# Patient Record
Sex: Male | Born: 1995 | Race: White | Hispanic: No | Marital: Single | Smoking: Current every day smoker
Health system: Southern US, Community
[De-identification: ages and names within clinical notes are randomized; demographics above are authoritative.]

---
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
## 2017-09-18 ENCOUNTER — Ambulatory Visit: Payer: Self-pay | Admitting: Family Medicine

## 2017-09-20 ENCOUNTER — Ambulatory Visit: Payer: 59 | Admitting: Family Medicine

## 2017-09-20 ENCOUNTER — Encounter: Payer: Self-pay | Admitting: Family Medicine

## 2017-09-20 VITALS — BP 124/80 | HR 72 | Ht 75.0 in | Wt 196.0 lb

## 2017-09-20 DIAGNOSIS — B354 Tinea corporis: Secondary | ICD-10-CM | POA: Diagnosis not present

## 2017-09-20 DIAGNOSIS — F17200 Nicotine dependence, unspecified, uncomplicated: Secondary | ICD-10-CM | POA: Diagnosis not present

## 2017-09-20 DIAGNOSIS — B079 Viral wart, unspecified: Secondary | ICD-10-CM

## 2017-09-20 DIAGNOSIS — L03111 Cellulitis of right axilla: Secondary | ICD-10-CM | POA: Diagnosis not present

## 2017-09-20 MED ORDER — MUPIROCIN 2 % EX OINT: 1.0000 "application " | TOPICAL_OINTMENT | Freq: Two times a day (BID) | CUTANEOUS | 0 refills | Status: DC

## 2017-09-20 MED ORDER — NICOTINE 7 MG/24HR TD PT24: 7.0000 mg | MEDICATED_PATCH | Freq: Every day | TRANSDERMAL | 0 refills | Status: DC

## 2017-09-20 MED ORDER — CEPHALEXIN 500 MG PO CAPS
500.0000 mg | ORAL_CAPSULE | Freq: Four times a day (QID) | ORAL | 0 refills | Status: DC
Start: 2017-09-20 — End: 2017-10-05

## 2017-09-20 MED ORDER — CLOTRIMAZOLE-BETAMETHASONE 1-0.05 % EX CREA: 1.0000 "application " | TOPICAL_CREAM | Freq: Two times a day (BID) | CUTANEOUS | 0 refills | Status: DC

## 2017-09-20 NOTE — Progress Notes (Signed)
Name: Richard Oneill   MRN: [id number]    DOB: [birth year]   Date:09/20/2017       Progress Note  Subjective  Chief Complaint  Chief Complaint  Patient presents with  . Nicotine Dependence    vaping- ready to quit/ discuss options  . warts on hand    Verbal consent for wart removal was obtained. Appropriate cleansing performed and liquid nitrogen applied to warts located on both hands and both elbows. Recent Vital Signs  BP 124/80   Pulse 72   Ht 6\' 3"  (1.905 m)   Wt 196 lb Warts multiple verucous warts both hands and both elbows./ present for "close to a year".  Aftercare Instructions:  Risks of bleeding and recurrence were discussed. Education provided about the possibility of blister formation. Patient has been advised to keep areas clean and dry. Educated about signs and symptoms of infection. All of the patient's questions were answered and the patient's concerns were addressed. Advised to return for re-treatment as needed.  Nicotine Dependence  Presents for initial visit. Symptoms include cravings. Symptoms are negative for insomnia and sore throat. Preferred tobacco type: vape. Preferred cigarettes are menthol. His urge triggers include company of smokers. His first smoke is from 6 to 8 AM. Past treatments include nothing.  Rash  This is a new problem. The current episode started 1 to 4 weeks ago. The problem has been gradually worsening since onset. The affected locations include the right axilla. The rash is characterized by burning and redness. Associated with: deordorant. Pertinent negatives include no cough, diarrhea, fever, joint pain, shortness of breath or sore throat.    No problem-specific Assessment & Plan notes found for this encounter.   No past medical history on file.  No past surgical history on file.  Family History  Problem Relation Age of Onset  . Hypertension Maternal Grandmother     Social History   Socioeconomic History  . Marital status: Single     Spouse name: Not on file  . Number of children: Not on file  . Years of education: Not on file  . Highest education level: Not on file  Occupational History  . Not on file  Social Needs  . Financial resource strain: Not on file  . Food insecurity:    Worry: Not on file    Inability: Not on file  . Transportation needs:    Medical: Not on file    Non-medical: Not on file  Tobacco Use  . Smoking status: Current Every Day Smoker    Types: E-cigarettes  . Smokeless tobacco: Never Used  Substance and Sexual Activity  . Alcohol use: No  . Drug use: Never  . Sexual activity: Not Currently  Lifestyle  . Physical activity:    Days per week: Not on file    Minutes per session: Not on file  . Stress: Not on file  Relationships  . Social connections:    Talks on phone: Not on file    Gets together: Not on file    Attends religious service: Not on file    Active member of club or organization: Not on file    Attends meetings of clubs or organizations: Not on file    Relationship status: Not on file  . Intimate partner violence:    Fear of current or ex partner: Not on file    Emotionally abused: Not on file    Physically abused: Not on file    Forced sexual activity: Not  on file  Other Topics Concern  . Not on file  Social History Narrative  . Not on file    No Known Allergies  No outpatient medications prior to visit.   No facility-administered medications prior to visit.     Review of Systems  Constitutional: Negative for chills, fever, malaise/fatigue and weight loss.  HENT: Negative for ear discharge, ear pain and sore throat.   Eyes: Negative for blurred vision.  Respiratory: Negative for cough, sputum production, shortness of breath and wheezing.   Cardiovascular: Negative for chest pain, palpitations and leg swelling.  Gastrointestinal: Negative for abdominal pain, blood in stool, constipation, diarrhea, heartburn, melena and nausea.  Genitourinary: Negative  for dysuria, frequency, hematuria and urgency.  Musculoskeletal: Negative for back pain, joint pain, myalgias and neck pain.  Skin: Positive for rash.  Neurological: Negative for dizziness, tingling, sensory change, focal weakness and headaches.  Endo/Heme/Allergies: Negative for environmental allergies and polydipsia. Does not bruise/bleed easily.  Psychiatric/Behavioral: Negative for depression and suicidal ideas. The patient is not nervous/anxious and does not have insomnia.      Objective  Vitals:   09/20/17 1111  BP: 124/80  Pulse: 72  Weight: 196 lb (88.9 kg)  Height: 6\' 3"  (1.905 m)   Physical Exam  Constitutional: He is oriented to person, place, and time.  HENT:  Head: Normocephalic.  Right Ear: External ear normal.  Left Ear: External ear normal.  Nose: Nose normal.  Mouth/Throat: Oropharynx is clear and moist.  Eyes: Pupils are equal, round, and reactive to light. Conjunctivae and EOM are normal. Right eye exhibits no discharge. Left eye exhibits no discharge. No scleral icterus.  Neck: Normal range of motion. Neck supple. No JVD present. No tracheal deviation present. No thyromegaly present.  Cardiovascular: Normal rate, regular rhythm, normal heart sounds and intact distal pulses. Exam reveals no gallop and no friction rub.  No murmur heard. Pulmonary/Chest: Breath sounds normal. No respiratory distress. He has no wheezes. He has no rales.  Abdominal: Soft. Bowel sounds are normal. He exhibits no mass. There is no hepatosplenomegaly. There is no tenderness. There is no rebound, no guarding and no CVA tenderness.  Musculoskeletal: Normal range of motion. He exhibits no edema or tenderness.  Lymphadenopathy:    He has no cervical adenopathy.  Neurological: He is alert and oriented to person, place, and time. He has normal strength and normal reflexes. No cranial nerve deficit.  Skin: Skin is warm. No rash noted. There is erythema.  Verbal consent for wart removal was  obtained. Appropriate cleansing performed and liquid nitrogen applied to warts located on right hand,left ring finger, and both elbows. Warts are 0.5 hand,0.8 finger,1 cm elbows in size and appear verrucous in appearance. Procedure tolerated well.  Aftercare Instructions:  Risks of bleeding and recurrence were discussed. Education provided about the possibility of blister formation. Patient has been advised to keep areas clean and dry. Educated about signs and symptoms of infection. All of the patient's questions were answered and the patient's concerns were addressed. Advised to return for re-treatment as needed.Rash under right axillary erythematous.  Nursing note and vitals reviewed.      Assessment & Plan  Problem List Items Addressed This Visit    None    Visit Diagnoses    Cellulitis of right axilla    -  Primary   Secondary infection of axillary tinea corporis will treat topically with bactroban and systemically with cephalexin 500mg  qid dor 10 days.   Relevant Medications  mupirocin ointment (BACTROBAN) 2 %   cephALEXin (KEFLEX) 500 MG capsule   Viral warts, unspecified type       Cryotherapy applied with formation of ice ball to all 4 warts and repeated x 2.   Relevant Medications   clotrimazole-betamethasone (LOTRISONE) cream   mupirocin ointment (BACTROBAN) 2 %   cephALEXin (KEFLEX) 500 MG capsule   Tinea corporis       tinea started in axillary area and will treat with lotrisone cream bid.   Relevant Medications   clotrimazole-betamethasone (LOTRISONE) cream   mupirocin ointment (BACTROBAN) 2 %   cephALEXin (KEFLEX) 500 MG capsule   Nicotine dependence, uncomplicated, unspecified nicotine product type       Patient wishes to cease vaping with failure of cold [place]. will start with low dosage nicodern patch q 24 hours for 30 days.   Relevant Medications   nicotine (NICODERM CQ - DOSED IN MG/24 HR) 7 mg/24hr patch      Meds ordered this encounter  Medications  .  clotrimazole-betamethasone (LOTRISONE) cream    Sig: Apply 1 application topically 2 (two) times daily.    Dispense:  30 g    Refill:  0  . mupirocin ointment (BACTROBAN) 2 %    Sig: Apply 1 application topically 2 (two) times daily.    Dispense:  22 g    Refill:  0  . cephALEXin (KEFLEX) 500 MG capsule    Sig: Take 1 capsule (500 mg total) by mouth 4 (four) times daily.    Dispense:  40 capsule    Refill:  0  . nicotine (NICODERM CQ - DOSED IN MG/24 HR) 7 mg/24hr patch    Sig: Place 1 patch (7 mg total) onto the skin daily.    Dispense:  28 patch    Refill:  0   Verbal consent for wart removal was obtained. Appropriate cleansing performed and liquid nitrogen applied to warts located on both elbows/left ring figer/right hand. Warts are   Verbal consent for wart removal was obtained. Appropriate cleansing performed and liquid nitrogen applied to warts located on right hand, left ring finger,both elbows. Warts are 0.5 cm hand,0.8 cm finger, and 1 cm both elbows in size and appear verucous. Procedure tolerated well.  Aftercare Instructions:  Risks of bleeding and recurrence were discussed. Education provided about the possibility of blister formation. Patient has been advised to keep areas clean and dry. Educated about signs and symptoms of infection. All of the patient's questions were answered and the patient's concerns were addressed. Advised to return for re-treatment as needed.           Abnormal Labs:  Otilio Miu 09/20/2017, 1:20 PM hand/.8 finger/1 cm elbows in size and appear verucucous in nature. Procedure tolerated well.  Aftercare Instructions:  Risks of bleeding and recurrence were discussed. Education provided about the possibility of blister formation. Patient has been advised to keep areas clean and dry. Educated about signs and symptoms of infection. All of the patient's questions were answered and the patient's concerns were addressed. Advised to return for  re-treatment as needed. Cryosurgery applied to each mole x 4 with formation of ice ball and repeated x 2.  Dr. [facility]  09/20/17

## 2017-10-05 ENCOUNTER — Encounter: Payer: Self-pay | Admitting: Family Medicine

## 2017-10-05 ENCOUNTER — Ambulatory Visit (INDEPENDENT_AMBULATORY_CARE_PROVIDER_SITE_OTHER): Payer: 59 | Admitting: Family Medicine

## 2017-10-05 VITALS — BP 120/70 | HR 80 | Ht 75.0 in | Wt 196.0 lb

## 2017-10-05 DIAGNOSIS — Z130 Encounter for screening for diseases of the blood and blood-forming organs and certain disorders involving the immune mechanism: Secondary | ICD-10-CM | POA: Diagnosis not present

## 2017-10-05 DIAGNOSIS — Z025 Encounter for examination for participation in sport: Secondary | ICD-10-CM | POA: Diagnosis not present

## 2017-10-05 NOTE — Progress Notes (Signed)
Name: Richard Oneill   MRN: [id number]    DOB: [birth year]   Date:10/05/2017       Progress Note  Subjective  Chief Complaint  Chief Complaint  Patient presents with  . Annual Exam    needs for college/ sickle cell testing    Patient needs college physical with sickle cell evaluate.   No problem-specific Assessment & Plan notes found for this encounter.   No past medical history on file.  No past surgical history on file.  Family History  Problem Relation Age of Onset  . Hypertension Maternal Grandmother     Social History   Socioeconomic History  . Marital status: Single    Spouse name: Not on file  . Number of children: Not on file  . Years of education: Not on file  . Highest education level: Not on file  Occupational History  . Not on file  Social Needs  . Financial resource strain: Not on file  . Food insecurity:    Worry: Not on file    Inability: Not on file  . Transportation needs:    Medical: Not on file    Non-medical: Not on file  Tobacco Use  . Smoking status: Former Smoker    Types: E-cigarettes  . Smokeless tobacco: Never Used  Substance and Sexual Activity  . Alcohol use: No  . Drug use: Never  . Sexual activity: Not Currently  Lifestyle  . Physical activity:    Days per week: Not on file    Minutes per session: Not on file  . Stress: Not on file  Relationships  . Social connections:    Talks on phone: Not on file    Gets together: Not on file    Attends religious service: Not on file    Active member of club or organization: Not on file    Attends meetings of clubs or organizations: Not on file    Relationship status: Not on file  . Intimate partner violence:    Fear of current or ex partner: Not on file    Emotionally abused: Not on file    Physically abused: Not on file    Forced sexual activity: Not on file  Other Topics Concern  . Not on file  Social History Narrative  . Not on file    No Known Allergies  Outpatient  Medications Prior to Visit  Medication Sig Dispense Refill  . clotrimazole-betamethasone (LOTRISONE) cream Apply 1 application topically 2 (two) times daily. 30 g 0  . mupirocin ointment (BACTROBAN) 2 % Apply 1 application topically 2 (two) times daily. 22 g 0  . nicotine (NICODERM CQ - DOSED IN MG/24 HR) 7 mg/24hr patch Place 1 patch (7 mg total) onto the skin daily. 28 patch 0  . cephALEXin (KEFLEX) 500 MG capsule Take 1 capsule (500 mg total) by mouth 4 (four) times daily. 40 capsule 0   No facility-administered medications prior to visit.     Review of Systems  Constitutional: Negative for chills, fever, malaise/fatigue and weight loss.  HENT: Negative for ear discharge, ear pain and sore throat.   Eyes: Negative for blurred vision.  Respiratory: Negative for cough, sputum production, shortness of breath and wheezing.   Cardiovascular: Negative for chest pain, palpitations and leg swelling.  Gastrointestinal: Negative for abdominal pain, blood in stool, constipation, diarrhea, heartburn, melena and nausea.  Genitourinary: Negative for dysuria, frequency, hematuria and urgency.  Musculoskeletal: Negative for back pain, joint pain, myalgias and neck pain.  Skin: Negative for rash.  Neurological: Negative for dizziness, tingling, sensory change, focal weakness and headaches.  Endo/Heme/Allergies: Negative for environmental allergies and polydipsia. Does not bruise/bleed easily.  Psychiatric/Behavioral: Negative for depression and suicidal ideas. The patient is not nervous/anxious and does not have insomnia.      Objective  Vitals:   10/05/17 0944  BP: 120/70  Pulse: 80  Weight: 196 lb (88.9 kg)  Height: 6\' 3"  (1.905 m)    Physical Exam  Constitutional: He is oriented to person, place, and time.  HENT:  Head: Normocephalic.  Right Ear: External ear normal.  Left Ear: External ear normal.  Nose: Nose normal.  Mouth/Throat: Oropharynx is clear and moist.  Eyes: Pupils are  equal, round, and reactive to light. Conjunctivae and EOM are normal. Right eye exhibits no discharge. Left eye exhibits no discharge. No scleral icterus.  Neck: Normal range of motion. Neck supple. No JVD present. No tracheal deviation present. No thyromegaly present.  Cardiovascular: Normal rate, regular rhythm, normal heart sounds and intact distal pulses. Exam reveals no gallop and no friction rub.  No murmur heard. Pulmonary/Chest: Breath sounds normal. No respiratory distress. He has no wheezes. He has no rales.  Abdominal: Soft. Bowel sounds are normal. He exhibits no mass. There is no hepatosplenomegaly. There is no tenderness. There is no rebound, no guarding and no CVA tenderness.  Genitourinary: Testes normal and penis normal. Right testis shows no mass, no swelling and no tenderness. Right testis is descended. Cremasteric reflex is not absent on the right side. Left testis shows no mass, no swelling and no tenderness. Left testis is descended. Cremasteric reflex is not absent on the left side. Circumcised.  Musculoskeletal: Normal range of motion. He exhibits no edema or tenderness.  Lymphadenopathy:    He has no cervical adenopathy.  Neurological: He is alert and oriented to person, place, and time. He has normal strength and normal reflexes. No cranial nerve deficit.  Skin: Skin is warm. No rash noted.  Nursing note and vitals reviewed.     Assessment & Plan  Problem List Items Addressed This Visit      Other   Screening for sickle-cell disease or trait   Relevant Orders   Sickle Cell Scr    Other Visit Diagnoses    Sports physical    -  Primary   proceed with sports      No orders of the defined types were placed in this encounter.     Dr. [facility]  10/05/17

## 2017-10-07 LAB — SICKLE CELL SCREEN: Sickle Cell Screen: NEGATIVE

## 2018-04-15 ENCOUNTER — Ambulatory Visit: Payer: 59 | Admitting: Family Medicine

## 2018-04-15 ENCOUNTER — Encounter: Payer: Self-pay | Admitting: Family Medicine

## 2018-04-30 ENCOUNTER — Encounter: Payer: Self-pay | Admitting: Family Medicine

## 2018-04-30 ENCOUNTER — Ambulatory Visit: Payer: 59 | Admitting: Family Medicine

## 2018-04-30 VITALS — BP 110/80 | HR 70 | Ht 75.0 in | Wt 186.0 lb

## 2018-04-30 DIAGNOSIS — Z87820 Personal history of traumatic brain injury: Secondary | ICD-10-CM

## 2018-04-30 DIAGNOSIS — D352 Benign neoplasm of pituitary gland: Secondary | ICD-10-CM | POA: Diagnosis not present

## 2018-04-30 DIAGNOSIS — E229 Hyperfunction of pituitary gland, unspecified: Secondary | ICD-10-CM

## 2018-04-30 DIAGNOSIS — R519 Headache, unspecified: Secondary | ICD-10-CM

## 2018-04-30 DIAGNOSIS — R51 Headache: Secondary | ICD-10-CM | POA: Diagnosis not present

## 2018-04-30 NOTE — Progress Notes (Signed)
Date:  04/30/2018   Name:  Richard Oneill   DOB:  11-19-1995   MRN:  [id number]   Chief Complaint: Headache (32mm mass in pituitary gland found on MRI at school) and Weight Loss (discuss findings at [facility])  Headache   This is a chronic problem. The current episode started more than 1 year ago. The problem occurs intermittently. The problem has been waxing and waning. The pain is located in the frontal region. The pain does not radiate. The pain quality is similar to prior headaches. The quality of the pain is described as aching. Associated symptoms include dizziness, nausea, photophobia, a visual change and weight loss. Pertinent negatives include no abdominal pain, abnormal behavior, anorexia, back pain, coughing, drainage, ear pain, eye watering, facial sweating, fever, hearing loss, insomnia, loss of balance, neck pain, scalp tenderness, seizures, sore throat or vomiting. He has tried nothing for the symptoms. The treatment provided mild relief. His past medical history is significant for recent head traumas. There is no history of hypertension. (Microadenoma)    Review of Systems  Constitutional: Positive for weight loss. Negative for chills and fever.  HENT: Negative for drooling, ear discharge, ear pain, hearing loss and sore throat.   Eyes: Positive for photophobia.  Respiratory: Negative for cough, chest tightness, shortness of breath and wheezing.   Cardiovascular: Negative for chest pain, palpitations and leg swelling.  Gastrointestinal: Positive for nausea. Negative for abdominal pain, anorexia, blood in stool, constipation, diarrhea and vomiting.  Endocrine: Negative for polydipsia.  Genitourinary: Negative for dysuria, frequency, hematuria and urgency.  Musculoskeletal: Negative for back pain, myalgias and neck pain.  Skin: Negative for rash.  Allergic/Immunologic: Negative for environmental allergies.  Neurological: Positive for dizziness and headaches. Negative for seizures  and loss of balance.  Hematological: Does not bruise/bleed easily.  Psychiatric/Behavioral: Negative for suicidal ideas. The patient is not nervous/anxious and does not have insomnia.     Patient Active Problem List   Diagnosis Date Noted  . Screening for sickle-cell disease or trait 10/05/2017    No Known Allergies  No past surgical history on file.  Social History   Tobacco Use  . Smoking status: Current Every Day Smoker    Types: E-cigarettes  . Smokeless tobacco: Never Used  Substance Use Topics  . Alcohol use: No  . Drug use: Never     Medication list has been reviewed and updated.  No outpatient medications have been marked as taking for the 04/30/18 encounter (Office Visit) with Richard Patch, MD.    [facility] 2/9 Scores 09/20/2017  PHQ - 2 Score 0  PHQ- 9 Score 2    Physical Exam Vitals signs and nursing note reviewed.  HENT:     Head: Normocephalic.     Right Ear: External ear normal.     Left Ear: External ear normal.     Nose: Nose normal.  Eyes:     General: Vision grossly intact. No visual field deficit or scleral icterus.       Right eye: No discharge.        Left eye: No discharge.     Extraocular Movements: Extraocular movements intact.     Right eye: Normal extraocular motion.     Left eye: Normal extraocular motion.     Conjunctiva/sclera: Conjunctivae normal.     Pupils: Pupils are equal, round, and reactive to light.     Right eye: Pupil is not sluggish.     Left eye: Pupil is not  sluggish.  Neck:     Musculoskeletal: Normal range of motion and neck supple.     Thyroid: No thyromegaly.     Vascular: No JVD.     Trachea: No tracheal deviation.  Cardiovascular:     Rate and Rhythm: Normal rate and regular rhythm.     Heart sounds: Normal heart sounds. No murmur. No friction rub. No gallop.   Pulmonary:     Effort: No respiratory distress.     Breath sounds: Normal breath sounds. No wheezing or rales.  Abdominal:     General: Bowel sounds  are normal.     Palpations: Abdomen is soft. There is no mass.     Tenderness: There is no abdominal tenderness. There is no guarding or rebound.  Musculoskeletal: Normal range of motion.        General: No tenderness.  Lymphadenopathy:     Cervical: No cervical adenopathy.  Skin:    General: Skin is warm.     Findings: No rash.  Neurological:     Mental Status: He is alert and oriented to person, place, and time.     Cranial Nerves: No cranial nerve deficit, dysarthria or facial asymmetry.     Sensory: No sensory deficit.     Motor: No weakness.     Coordination: Romberg sign negative. Coordination normal.     Gait: Gait normal.     Deep Tendon Reflexes: Reflexes are normal and symmetric. Reflexes normal.     BP 110/80   Pulse 70   Ht 6\' 3"  (1.905 m)   Wt 186 lb (84.4 kg)   BMI 23.25 kg/m   Assessment and Plan: 1. Recurrent headache Patient has had recurrent headaches for approximately 10 years.  Had a CT scan that was normal in 2011 but recent rise have detected a pituitary microadenoma.  Patient continues to have headaches the question is headaches related to his pathology, another etiology of headache such as migraine, or the possibility of CTE because of his history of concussions playing football.  Neurology consult with at least for 05/09/2018. - Ambulatory referral to Neurology  2. Pituitary microadenoma with hyperprolactinemia [facility]) Patient is followed by Dr. Tommi Rumps neurosurgery at [facility] currently is being watched with no medications.  Patient has also been evaluated by endocrinology for further evaluation of his microadenoma.  Defer to them after neurology consult  3. 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  He recalls at least 4 concussions over high school suspect there is probably been more.  And is also played prior to that middle school and rec creation of football.  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  My concern is that he may have CTE/ chronic traumatic encephalopathy possibility - Ambulatory referral to Neurology

## 2018-09-13 ENCOUNTER — Encounter: Payer: Self-pay | Admitting: Family Medicine

## 2018-09-13 ENCOUNTER — Other Ambulatory Visit: Payer: Self-pay

## 2018-09-13 ENCOUNTER — Ambulatory Visit (INDEPENDENT_AMBULATORY_CARE_PROVIDER_SITE_OTHER): Payer: 59 | Admitting: Family Medicine

## 2018-09-13 VITALS — BP 120/80 | HR 84 | Ht 75.0 in | Wt 178.0 lb

## 2018-09-13 DIAGNOSIS — N528 Other male erectile dysfunction: Secondary | ICD-10-CM

## 2018-09-13 DIAGNOSIS — F0781 Postconcussional syndrome: Secondary | ICD-10-CM | POA: Diagnosis not present

## 2018-09-13 DIAGNOSIS — F419 Anxiety disorder, unspecified: Secondary | ICD-10-CM | POA: Diagnosis not present

## 2018-09-13 DIAGNOSIS — F321 Major depressive disorder, single episode, moderate: Secondary | ICD-10-CM

## 2018-09-13 DIAGNOSIS — D352 Benign neoplasm of pituitary gland: Secondary | ICD-10-CM

## 2018-09-13 MED ORDER — SERTRALINE HCL 50 MG PO TABS: 50.0000 mg | ORAL_TABLET | Freq: Every day | ORAL | 1 refills | Status: DC

## 2018-09-13 NOTE — Progress Notes (Signed)
Date:  09/13/2018   Name:  Richard Oneill   DOB:  [birth year]   MRN:  [id number]   Chief Complaint: Headache (less headaches, has turned "into a mood swing type of thing. I can snap"); Erectile Dysfunction (was seeing a doctor in [place]- needs urologist closer); and Depression (PHQ9=17 and GAD7=18/ wants to go to psych)  Erectile Dysfunction  This is a new problem. The current episode started more than 1 month ago. The problem has been gradually improving since onset. The nature of his difficulty is achieving erection. Non-physiologic factors contributing to erectile dysfunction are anxiety. He reports no decreased libido or performance anxiety. He reports his erection duration to be 5 to 10 minutes. Irritative symptoms do not include frequency, nocturia or urgency. Obstructive symptoms do not include dribbling, incomplete emptying, an intermittent stream, a slower stream, straining or a weak stream. Pertinent negatives include no chills, dysuria, genital pain, hematuria, hesitancy or inability to urinate. Nothing aggravates the symptoms. Past treatments include sildenafil (all no relief). The treatment provided no relief. He has been using treatment for 1 to 6 months. He has had no adverse reactions caused by medications. There are no known risk factors.  Depression         The patient presents with no depression.(Post concussion)  This is a new problem.  The current episode started more than 1 year ago.   The onset quality is undetermined.   The problem occurs constantly.  The problem has been gradually worsening since onset.  Associated symptoms include decreased concentration, fatigue, helplessness, hopelessness, insomnia, irritable, restlessness, decreased interest, appetite change, body aches, myalgias, headaches, indigestion and sad.  Associated symptoms include no suicidal ideas.  Compliance with treatment is variable.  Previous treatment provided moderate relief.   Pertinent negatives include no  chronic fatigue syndrome, no chronic pain, no fibromyalgia, no hypothyroidism, no thyroid problem, no chronic illness, no recent illness, no life-threatening condition, no physical disability, no terminal illness, no recent psychiatric admission, no Alzheimer's disease, no brain trauma, no dementia, no anxiety, no bipolar disorder, no eating disorder, no depression, no mental health disorder, no obsessive-compulsive disorder, no post-traumatic stress disorder, no schizophrenia, no suicide attempts and no head trauma. Anxiety  Presents for follow-up visit. Symptoms include confusion, decreased concentration, excessive worry, insomnia, nervous/anxious behavior and restlessness. Patient reports no chest pain, depressed mood, dizziness, nausea, palpitations, shortness of breath or suicidal ideas. Primary symptoms comment: post concussion. The severity of symptoms is moderate.   There is no history of depression or suicide attempts.    Review of Systems  Constitutional: Positive for appetite change and fatigue. Negative for chills and fever.  HENT: Negative for drooling, ear discharge, ear pain and sore throat.   Respiratory: Negative for cough, shortness of breath and wheezing.   Cardiovascular: Negative for chest pain, palpitations and leg swelling.  Gastrointestinal: Negative for abdominal pain, blood in stool, constipation, diarrhea and nausea.  Endocrine: Negative for polydipsia.  Genitourinary: Negative for decreased libido, dysuria, frequency, hematuria, hesitancy, incomplete emptying, nocturia and urgency.  Musculoskeletal: Positive for myalgias. Negative for back pain and neck pain.  Skin: Negative for rash.  Allergic/Immunologic: Negative for environmental allergies.  Neurological: Positive for headaches. Negative for dizziness.  Hematological: Does not bruise/bleed easily.  Psychiatric/Behavioral: Positive for confusion, decreased concentration and depression. Negative for suicidal ideas.  The patient is nervous/anxious and has insomnia.     Patient Active Problem List   Diagnosis Date Noted   Screening for sickle-cell disease or  trait 10/05/2017    No Known Allergies  No past surgical history on file.  Social History   Tobacco Use   Smoking status: Current Every Day Smoker    Types: E-cigarettes   Smokeless tobacco: Never Used  Substance Use Topics   Alcohol use: No   Drug use: Never     Medication list has been reviewed and updated.  No outpatient medications have been marked as taking for the 09/13/18 encounter (Office Visit) with Juline Patch, MD.    [facility] 2/9 Scores 09/13/2018 09/20/2017  PHQ - 2 Score 5 0  PHQ- 9 Score 17 2    BP Readings from Last 3 Encounters:  09/13/18 120/80  04/30/18 110/80  10/05/17 120/70    Physical Exam Vitals signs and nursing note reviewed.  Constitutional:      General: He is irritable.     Appearance: He is normal weight.  HENT:     Head: Normocephalic.     Right Ear: External ear normal.     Left Ear: External ear normal.     Nose: Nose normal.  Eyes:     General: No scleral icterus.       Right eye: No discharge.        Left eye: No discharge.     Conjunctiva/sclera: Conjunctivae normal.     Pupils: Pupils are equal, round, and reactive to light.  Neck:     Musculoskeletal: Normal range of motion and neck supple.     Thyroid: No thyromegaly.     Vascular: No JVD.     Trachea: No tracheal deviation.  Cardiovascular:     Rate and Rhythm: Normal rate and regular rhythm.     Heart sounds: Normal heart sounds. No murmur. No friction rub. No gallop.   Pulmonary:     Effort: No respiratory distress.     Breath sounds: Normal breath sounds. No stridor. No wheezing, rhonchi or rales.  Chest:     Chest wall: No tenderness.  Abdominal:     General: Bowel sounds are normal. There is no distension.     Palpations: Abdomen is soft. There is no mass.     Tenderness: There is no abdominal tenderness. There  is no guarding or rebound.  Musculoskeletal: Normal range of motion.        General: No tenderness.  Lymphadenopathy:     Cervical: No cervical adenopathy.  Skin:    General: Skin is warm.     Findings: No rash.  Neurological:     Mental Status: He is alert and oriented to person, place, and time.     Cranial Nerves: No cranial nerve deficit.     Deep Tendon Reflexes: Reflexes are normal and symmetric.     Wt Readings from Last 3 Encounters:  09/13/18 178 lb (80.7 kg)  04/30/18 186 lb (84.4 kg)  10/05/17 196 lb (88.9 kg) (91 %, Z= 1.34)*   * Growth percentiles are based on CDC (Boys, 2-20 Years) data.    BP 120/80    Pulse 84    Ht 6\' 3"  (1.905 m)    Wt 178 lb (80.7 kg)    BMI 22.25 kg/m   Assessment and Plan: 1. Pituitary adenoma [facility]) Patient is followed in [place] and has seen Dr. Tommi Rumps at [facility] for pituitary adenoma which apparently is small and there is some suggestion there is been decrease in the prolactin circumstances since this is been what we are going to repeat his prolactin today  is been about 6 months and as well as his testosterone ongoing initially referred to endocrinology to follow-up if there is anything else that needs to be followed up on as far as the levels and the timing of this in the future in the meantime we will going to pursue some neurologic concerns that I have for his anxiety and depression seen below - Prolactin - Testosterone,Free and Total - Ambulatory referral to Endocrinology  2. Current moderate episode of major depressive disorder, unspecified whether recurrent ([place]) Patient has a depression which has been persistent for the past several months we will initiate Zoloft tapering to 50 mg with an initial 2-week interval of 25 mg patient denies any suicidal thoughts and we have had some success with in the family with this particular medication.  PHQ score was 17 - Ambulatory referral to Neurology  3. Anxiety Gad score was 18 patient has had  most every category of anxiety positive to some degree.  Will initiate some sertraline at 25 mg to taper up to 50 mg. - Ambulatory referral to Neurology  4. 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  There was a.  That he was with observed for concussion and did not play and subsequent football games afterwards until he was totally cleared by neurology this point time the patient is become more depression more anxiety and question whether this could be part of a postconcussion syndrome as well as postconcussion mood disorder circumstance.  We will have this formally reevaluated by neurology. - Ambulatory referral to Neurology  5. Other male erectile dysfunction Patient has had some ED and wondering if this is more secondary to his depression but will recheck his testosterone given his history of adenoma of the pituitary pursue accordingly. - Testosterone,Free and Total

## 2018-09-18 ENCOUNTER — Other Ambulatory Visit: Payer: Self-pay

## 2018-09-18 DIAGNOSIS — D352 Benign neoplasm of pituitary gland: Secondary | ICD-10-CM

## 2018-09-18 DIAGNOSIS — R7989 Other specified abnormal findings of blood chemistry: Secondary | ICD-10-CM

## 2018-09-18 LAB — TESTOSTERONE,FREE AND TOTAL
Testosterone, Free: 15.9 pg/mL (ref 9.3–26.5)
Testosterone: 877 ng/dL (ref 264–916)

## 2018-09-18 LAB — PROLACTIN: Prolactin: 28.2 ng/mL — ABNORMAL HIGH (ref 4.0–15.2)

## 2018-09-18 NOTE — Progress Notes (Unsigned)
Ref endo placed

## 2018-10-05 ENCOUNTER — Other Ambulatory Visit: Payer: Self-pay | Admitting: Family Medicine

## 2018-10-05 DIAGNOSIS — F321 Major depressive disorder, single episode, moderate: Secondary | ICD-10-CM

## 2018-10-05 DIAGNOSIS — F419 Anxiety disorder, unspecified: Secondary | ICD-10-CM

## 2018-10-15 ENCOUNTER — Ambulatory Visit: Payer: 59 | Admitting: Family Medicine

## 2019-02-03 ENCOUNTER — Other Ambulatory Visit: Payer: Self-pay | Admitting: Family Medicine

## 2019-02-03 DIAGNOSIS — F419 Anxiety disorder, unspecified: Secondary | ICD-10-CM

## 2019-02-03 DIAGNOSIS — F321 Major depressive disorder, single episode, moderate: Secondary | ICD-10-CM

## 2019-03-05 ENCOUNTER — Other Ambulatory Visit: Payer: Self-pay | Admitting: Family Medicine

## 2019-03-05 DIAGNOSIS — F321 Major depressive disorder, single episode, moderate: Secondary | ICD-10-CM

## 2019-03-05 DIAGNOSIS — F419 Anxiety disorder, unspecified: Secondary | ICD-10-CM

## 2019-03-19 ENCOUNTER — Other Ambulatory Visit: Payer: Self-pay | Admitting: Family Medicine

## 2019-03-19 DIAGNOSIS — F321 Major depressive disorder, single episode, moderate: Secondary | ICD-10-CM

## 2019-03-19 DIAGNOSIS — F419 Anxiety disorder, unspecified: Secondary | ICD-10-CM

## 2019-04-12 ENCOUNTER — Other Ambulatory Visit: Payer: Self-pay | Admitting: Family Medicine

## 2019-04-12 DIAGNOSIS — F419 Anxiety disorder, unspecified: Secondary | ICD-10-CM

## 2019-04-12 DIAGNOSIS — F321 Major depressive disorder, single episode, moderate: Secondary | ICD-10-CM

## 2019-05-05 ENCOUNTER — Encounter: Payer: Self-pay | Admitting: Family Medicine

## 2019-05-05 ENCOUNTER — Ambulatory Visit: Payer: 59 | Admitting: Family Medicine

## 2019-05-05 ENCOUNTER — Other Ambulatory Visit: Payer: Self-pay

## 2019-05-05 VITALS — BP 120/70 | HR 76 | Ht 75.0 in | Wt 180.0 lb

## 2019-05-05 DIAGNOSIS — D1800 Hemangioma unspecified site: Secondary | ICD-10-CM

## 2019-05-05 DIAGNOSIS — F331 Major depressive disorder, recurrent, moderate: Secondary | ICD-10-CM | POA: Diagnosis not present

## 2019-05-05 DIAGNOSIS — R7989 Other specified abnormal findings of blood chemistry: Secondary | ICD-10-CM | POA: Diagnosis not present

## 2019-05-05 DIAGNOSIS — Z23 Encounter for immunization: Secondary | ICD-10-CM | POA: Diagnosis not present

## 2019-05-05 DIAGNOSIS — E229 Hyperfunction of pituitary gland, unspecified: Secondary | ICD-10-CM

## 2019-05-05 DIAGNOSIS — D352 Benign neoplasm of pituitary gland: Secondary | ICD-10-CM | POA: Diagnosis not present

## 2019-05-05 MED ORDER — SERTRALINE HCL 100 MG PO TABS: 100.0000 mg | ORAL_TABLET | Freq: Every day | ORAL | 3 refills | Status: DC

## 2019-05-05 NOTE — Progress Notes (Signed)
Date:  05/05/2019   Name:  Richard Oneill   DOB:  06-06-1995   MRN:  [id number]   Chief Complaint: Depression (PHQ9=9 and GAD7=2) and Flu Vaccine  Depression        The patient presents with no depression.  This is a chronic problem.  The current episode started more than 1 year ago.   The onset quality is gradual.   The problem occurs intermittently.  The problem has been waxing and waning since onset.  Associated symptoms include decreased concentration, fatigue, irritable, decreased interest and sad.  Associated symptoms include no helplessness, no hopelessness, does not have insomnia, no restlessness, no appetite change, no body aches, no myalgias, no headaches, no indigestion and no suicidal ideas.  Past treatments include SSRIs - Selective serotonin reuptake inhibitors.  Past medical history includes anxiety.     Pertinent negatives include no depression. Anxiety Presents for follow-up visit. Symptoms include decreased concentration, excessive worry and nervous/anxious behavior. Patient reports no chest pain, dizziness, insomnia, nausea, palpitations, restlessness, shortness of breath or suicidal ideas. Symptoms occur most days. The severity of symptoms is mild.   There is no history of depression.    No results found for: CREATININE, BUN, NA, K, CL, CO2 No results found for: CHOL, HDL, LDLCALC, LDLDIRECT, TRIG, CHOLHDL No results found for: TSH No results found for: HGBA1C   Review of Systems  Constitutional: Positive for fatigue. Negative for appetite change, chills and fever.  HENT: Negative for drooling, ear discharge, ear pain, postnasal drip, rhinorrhea, sinus pain and sore throat.   Respiratory: Negative for apnea, cough, choking, chest tightness, shortness of breath, wheezing and stridor.   Cardiovascular: Negative for chest pain, palpitations and leg swelling.  Gastrointestinal: Negative for abdominal pain, blood in stool, constipation, diarrhea and nausea.  Endocrine:  Negative for polydipsia.  Genitourinary: Negative for dysuria, frequency, hematuria and urgency.  Musculoskeletal: Negative for back pain, myalgias and neck pain.  Skin: Negative for rash.  Allergic/Immunologic: Negative for environmental allergies.  Neurological: Negative for dizziness and headaches.  Hematological: Does not bruise/bleed easily.  Psychiatric/Behavioral: Positive for decreased concentration and depression. Negative for suicidal ideas. The patient is nervous/anxious. The patient does not have insomnia.     Patient Active Problem List   Diagnosis Date Noted  . Current moderate episode of major depressive disorder ([place]) 09/13/2018  . Anxiety 09/13/2018  . Pituitary adenoma ([place]) 09/13/2018  . Screening for sickle-cell disease or trait 10/05/2017    No Known Allergies  No past surgical history on file.  Social History   Tobacco Use  . Smoking status: Current Every Day Smoker    Types: E-cigarettes  . Smokeless tobacco: Never Used  Substance Use Topics  . Alcohol use: No  . Drug use: Never     Medication list has been reviewed and updated.  No outpatient medications have been marked as taking for the 05/05/19 encounter (Office Visit) with Juline Patch, MD.    [facility] 2/9 Scores 05/05/2019 09/13/2018 09/20/2017  PHQ - 2 Score 0 5 0  PHQ- 9 Score 9 17 2     BP Readings from Last 3 Encounters:  05/05/19 120/70  09/13/18 120/80  04/30/18 110/80    Physical Exam Vitals and nursing note reviewed.  Constitutional:      General: He is irritable.  HENT:     Head: Normocephalic.     Right Ear: Tympanic membrane, ear canal and external ear normal.     Left Ear: Tympanic membrane,  ear canal and external ear normal.     Nose: Nose normal. No congestion or rhinorrhea.  Eyes:     General: No scleral icterus.       Right eye: No discharge.        Left eye: No discharge.     Conjunctiva/sclera: Conjunctivae normal.     Pupils: Pupils are equal, round, and reactive  to light.  Neck:     Thyroid: No thyromegaly.     Vascular: No JVD.     Trachea: No tracheal deviation.  Cardiovascular:     Rate and Rhythm: Normal rate and regular rhythm.     Heart sounds: Normal heart sounds. No murmur. No friction rub. No gallop.   Pulmonary:     Effort: No respiratory distress.     Breath sounds: Normal breath sounds. No wheezing or rales.  Abdominal:     General: Bowel sounds are normal.     Palpations: Abdomen is soft. There is no mass.     Tenderness: There is no abdominal tenderness. There is no guarding or rebound.  Musculoskeletal:        General: No tenderness. Normal range of motion.     Cervical back: Normal range of motion and neck supple.  Lymphadenopathy:     Cervical: No cervical adenopathy.  Skin:    General: Skin is warm.     Findings: No rash.  Neurological:     Mental Status: He is alert and oriented to person, place, and time.     Cranial Nerves: No cranial nerve deficit.     Deep Tendon Reflexes: Reflexes are normal and symmetric.     Wt Readings from Last 3 Encounters:  05/05/19 180 lb (81.6 kg)  09/13/18 178 lb (80.7 kg)  04/30/18 186 lb (84.4 kg)    BP 120/70   Pulse 76   Ht 6\' 3"  (1.905 m)   Wt 180 lb (81.6 kg)   BMI 22.50 kg/m   Assessment and Plan:  1. Moderate episode of recurrent major depressive disorder (HCC) Chronic.  Uncontrolled.  Stable.  PHQ noted to be 9 with a gad score of 2.  We will increase sertraline to 100 mg a day.  Patient has noted no suicidal concerns.  We will taper by taking 1-1/2 or 75 mg total dose of his 50 mg for 7 to 10 days and then increase to 100 mg. - sertraline (ZOLOFT) 100 MG tablet; Take 1 tablet (100 mg total) by mouth daily.  Dispense: 30 tablet; Refill: 3  2. Elevated prolactin level Patient has had a history of elevated prolactin level for which it was noted and patient did not proceed with endocrine but would like to do so now because of fatigue and no nipple discharge.  We will  refer to endocrinology for evaluation of her previously noted microadenoma with elevated prolactin level. - Ambulatory referral to Endocrinology  3. Pituitary microadenoma with hyperprolactinemia (HCC) Chronic.  Controlled.  There is no further concerns and no visual/you will feel concerns.  We will have this formally evaluated by endocrinology. - Ambulatory referral to Endocrinology  4. [place] Patient notes no headaches that he has had in the past and no evidence of any seizure activity or other issues.  Patient has had no unexplained syncope or near syncopal episodes.  Patient will return for recheck and we will further discuss this.  5. Influenza vaccine needed Discussed and administered - Flu Vaccine QUAD 6+ mos PF IM (Fluarix Quad PF)

## 2019-05-29 ENCOUNTER — Other Ambulatory Visit: Payer: Self-pay | Admitting: Family Medicine

## 2019-05-29 DIAGNOSIS — F331 Major depressive disorder, recurrent, moderate: Secondary | ICD-10-CM

## 2019-06-05 ENCOUNTER — Ambulatory Visit: Payer: 59 | Admitting: Family Medicine

## 2020-01-07 ENCOUNTER — Encounter: Payer: Self-pay | Admitting: Family Medicine

## 2020-01-07 ENCOUNTER — Other Ambulatory Visit: Payer: Self-pay

## 2020-01-07 ENCOUNTER — Ambulatory Visit: Payer: 59 | Admitting: Family Medicine

## 2020-01-07 VITALS — BP 130/80 | HR 80 | Ht 75.0 in | Wt 177.0 lb

## 2020-01-07 DIAGNOSIS — F419 Anxiety disorder, unspecified: Secondary | ICD-10-CM

## 2020-01-07 DIAGNOSIS — F321 Major depressive disorder, single episode, moderate: Secondary | ICD-10-CM | POA: Diagnosis not present

## 2020-01-07 DIAGNOSIS — Z23 Encounter for immunization: Secondary | ICD-10-CM

## 2020-01-07 MED ORDER — SERTRALINE HCL 50 MG PO TABS: 50.0000 mg | ORAL_TABLET | Freq: Every day | ORAL | 1 refills | Status: DC

## 2020-01-07 NOTE — Progress Notes (Signed)
Date:  01/07/2020   Name:  Richard Oneill   DOB:  Sep 16, 1995   MRN:  [id number]   Chief Complaint: Depression (been off x 3 months- 8 and 11) and Flu Vaccine  Depression        This is a chronic problem.  The current episode started more than 1 year ago.   The problem occurs intermittently.  The problem has been gradually worsening since onset.  Associated symptoms include decreased concentration, hopelessness, restlessness, decreased interest and sad.  Associated symptoms include no fatigue, no helplessness, does not have insomnia, not irritable, no myalgias, no headaches and no suicidal ideas.  Treatments tried: presently no medication.  Previous treatment provided moderate relief.   No results found for: CREATININE, BUN, NA, K, CL, CO2 No results found for: CHOL, HDL, LDLCALC, LDLDIRECT, TRIG, CHOLHDL No results found for: TSH No results found for: HGBA1C No results found for: WBC, HGB, HCT, MCV, PLT No results found for: ALT, AST, GGT, ALKPHOS, BILITOT   Review of Systems  Constitutional: Negative for chills, fatigue and fever.  HENT: Negative for drooling, ear discharge, ear pain, postnasal drip and sore throat.   Respiratory: Negative for cough, shortness of breath and wheezing.   Cardiovascular: Negative for chest pain, palpitations and leg swelling.  Gastrointestinal: Negative for abdominal pain, blood in stool, constipation, diarrhea and nausea.  Endocrine: Negative for polydipsia.  Genitourinary: Negative for dysuria, frequency, hematuria and urgency.  Musculoskeletal: Negative for back pain, myalgias and neck pain.  Skin: Negative for rash.  Allergic/Immunologic: Negative for environmental allergies.  Neurological: Negative for dizziness and headaches.  Hematological: Does not bruise/bleed easily.  Psychiatric/Behavioral: Positive for decreased concentration and depression. Negative for suicidal ideas. The patient is not nervous/anxious and does not have insomnia.      Patient Active Problem List   Diagnosis Date Noted  . Current moderate episode of major depressive disorder ([place]) 09/13/2018  . Anxiety 09/13/2018  . Pituitary adenoma ([place]) 09/13/2018  . Screening for sickle-cell disease or trait 10/05/2017    No Known Allergies  No past surgical history on file.  Social History   Tobacco Use  . Smoking status: Current Every Day Smoker    Types: E-cigarettes  . Smokeless tobacco: Never Used  Substance Use Topics  . Alcohol use: No  . Drug use: Never     Medication list has been reviewed and updated.  Current Meds  Medication Sig  . sertraline (ZOLOFT) 50 MG tablet TAKE 1 TABLET BY MOUTH EVERY DAY    PHQ 2/9 Scores 01/07/2020 05/05/2019 09/13/2018 09/20/2017  PHQ - 2 Score 4 0 5 0  PHQ- 9 Score 8 9 17 2     GAD 7 : Generalized Anxiety Score 01/07/2020 05/05/2019 09/13/2018  Nervous, Anxious, on Edge 1 0 3  Control/stop worrying 2 0 3  Worry too much - different things 2 0 3  Trouble relaxing 2 1 3   Restless 2 0 3  Easily annoyed or irritable 2 1 3   Afraid - awful might happen 0 0 0  Total GAD 7 Score 11 2 18   Anxiety Difficulty Somewhat difficult Not difficult at all Somewhat difficult    BP Readings from Last 3 Encounters:  01/07/20 130/80  05/05/19 120/70  09/13/18 120/80    Physical Exam Vitals and nursing note reviewed.  Constitutional:      General: He is not irritable. HENT:     Head: Normocephalic.     Right Ear: Tympanic membrane, ear canal  and external ear normal.     Left Ear: Tympanic membrane, ear canal and external ear normal.     Nose: Nose normal. No congestion or rhinorrhea.     Mouth/Throat:     Mouth: Mucous membranes are moist.  Eyes:     General: No scleral icterus.       Right eye: No discharge.        Left eye: No discharge.     Conjunctiva/sclera: Conjunctivae normal.     Pupils: Pupils are equal, round, and reactive to light.  Neck:     Thyroid: No thyromegaly.     Vascular: No JVD.      Trachea: No tracheal deviation.  Cardiovascular:     Rate and Rhythm: Normal rate and regular rhythm.     Heart sounds: Normal heart sounds. No murmur heard.  No friction rub. No gallop.   Pulmonary:     Effort: No respiratory distress.     Breath sounds: Normal breath sounds. No wheezing, rhonchi or rales.  Abdominal:     General: Bowel sounds are normal.     Palpations: Abdomen is soft. There is no mass.     Tenderness: There is no abdominal tenderness. There is no guarding or rebound.  Musculoskeletal:        General: No tenderness. Normal range of motion.     Cervical back: Normal range of motion and neck supple.  Lymphadenopathy:     Cervical: No cervical adenopathy.  Skin:    General: Skin is warm.     Findings: No rash.  Neurological:     Mental Status: He is alert and oriented to person, place, and time.     Cranial Nerves: No cranial nerve deficit.     Deep Tendon Reflexes: Reflexes are normal and symmetric.     Wt Readings from Last 3 Encounters:  01/07/20 177 lb (80.3 kg)  05/05/19 180 lb (81.6 kg)  09/13/18 178 lb (80.7 kg)    BP 130/80   Pulse 80   Ht 6\' 3"  (1.905 m)   Wt 177 lb (80.3 kg)   BMI 22.12 kg/m   Assessment and Plan:  1. Current moderate episode of major depressive disorder, unspecified whether recurrent (HCC) Chronic.  Uncontrolled.  Gad score 8.  Patient has not been on medication used to be on 100 mg sertraline but did not feel well at that dosing so dropped down to 50 and eventually stopped completely.  Patient now realizes he needs to be on medication and we will resume sertraline at 50 mg once a day. - sertraline (ZOLOFT) 50 MG tablet; Take 1 tablet (50 mg total) by mouth daily.  Dispense: 90 tablet; Refill: 1  2. Anxiety Chronic.  Uncontrolled.  PHQ score 11 as noted above patient's been off medication and we are resuming sertraline at 50 mg once a day. - sertraline (ZOLOFT) 50 MG tablet; Take 1 tablet (50 mg total) by mouth daily.   Dispense: 90 tablet; Refill: 1  3. Need for immunization against influenza Discussed and administered - Flu Vaccine QUAD 36+ mos IM

## 2020-04-08 ENCOUNTER — Other Ambulatory Visit: Payer: Self-pay

## 2020-04-08 ENCOUNTER — Encounter: Payer: Self-pay | Admitting: Family Medicine

## 2020-04-08 ENCOUNTER — Ambulatory Visit: Payer: 59 | Admitting: Family Medicine

## 2020-04-08 VITALS — BP 130/80 | HR 64 | Ht 75.0 in | Wt 176.0 lb

## 2020-04-08 DIAGNOSIS — F321 Major depressive disorder, single episode, moderate: Secondary | ICD-10-CM | POA: Diagnosis not present

## 2020-04-08 DIAGNOSIS — H6123 Impacted cerumen, bilateral: Secondary | ICD-10-CM | POA: Diagnosis not present

## 2020-04-08 DIAGNOSIS — F419 Anxiety disorder, unspecified: Secondary | ICD-10-CM | POA: Diagnosis not present

## 2020-04-08 MED ORDER — SERTRALINE HCL 50 MG PO TABS: 50.0000 mg | ORAL_TABLET | Freq: Every day | ORAL | 1 refills | Status: DC

## 2020-04-08 NOTE — Progress Notes (Signed)
Date:  04/08/2020   Name:  Richard Oneill   DOB:  1995-07-02   MRN:  [id number]   Chief Complaint: Depression (2 and 4)  Depression        This is a chronic problem.  The current episode started more than 1 year ago.   The onset quality is gradual.   The problem occurs intermittently.  The problem has been gradually improving since onset.  Associated symptoms include irritable and sad.  Associated symptoms include no decreased concentration, no fatigue, no helplessness, no hopelessness, does not have insomnia, no restlessness, no decreased interest, no appetite change, no body aches, no myalgias, no headaches, no indigestion and no suicidal ideas.  Past treatments include SSRIs - Selective serotonin reuptake inhibitors.  Compliance with treatment is variable.  Previous treatment provided mild relief.  Past medical history includes anxiety.   Anxiety Presents for follow-up visit. Symptoms include excessive worry, irritability and nervous/anxious behavior. Patient reports no chest pain, compulsions, confusion, decreased concentration, depressed mood, dizziness, dry mouth, feeling of choking, hyperventilation, impotence, insomnia, malaise, muscle tension, nausea, obsessions, palpitations, panic, restlessness, shortness of breath or suicidal ideas. Symptoms occur occasionally.      No results found for: CREATININE, BUN, NA, K, CL, CO2 No results found for: CHOL, HDL, LDLCALC, LDLDIRECT, TRIG, CHOLHDL No results found for: TSH No results found for: HGBA1C No results found for: WBC, HGB, HCT, MCV, PLT No results found for: ALT, AST, GGT, ALKPHOS, BILITOT   Review of Systems  Constitutional: Positive for irritability. Negative for appetite change, chills, fatigue and fever.  HENT: Negative for drooling, ear discharge, ear pain and sore throat.   Respiratory: Negative for cough, shortness of breath and wheezing.   Cardiovascular: Negative for chest pain, palpitations and leg swelling.   Gastrointestinal: Negative for abdominal pain, blood in stool, constipation, diarrhea and nausea.  Endocrine: Negative for polydipsia.  Genitourinary: Negative for dysuria, frequency, hematuria, impotence and urgency.  Musculoskeletal: Negative for back pain, myalgias and neck pain.  Skin: Negative for rash.  Allergic/Immunologic: Negative for environmental allergies.  Neurological: Negative for dizziness and headaches.  Hematological: Does not bruise/bleed easily.  Psychiatric/Behavioral: Positive for depression. Negative for confusion, decreased concentration and suicidal ideas. The patient is nervous/anxious. The patient does not have insomnia.     Patient Active Problem List   Diagnosis Date Noted  . Current moderate episode of major depressive disorder ([place]) 09/13/2018  . Anxiety 09/13/2018  . Pituitary adenoma ([place]) 09/13/2018  . Screening for sickle-cell disease or trait 10/05/2017    No Known Allergies  No past surgical history on file.  Social History   Tobacco Use  . Smoking status: Current Every Day Smoker    Types: E-cigarettes  . Smokeless tobacco: Never Used  Substance Use Topics  . Alcohol use: No  . Drug use: Never     Medication list has been reviewed and updated.  Current Meds  Medication Sig  . sertraline (ZOLOFT) 50 MG tablet Take 1 tablet (50 mg total) by mouth daily.    PHQ 2/9 Scores 04/08/2020 01/07/2020 05/05/2019 09/13/2018  PHQ - 2 Score 1 4 0 5  PHQ- 9 Score 2 8 9 17     GAD 7 : Generalized Anxiety Score 04/08/2020 01/07/2020 05/05/2019 09/13/2018  Nervous, Anxious, on Edge 1 1 0 3  Control/stop worrying 0 2 0 3  Worry too much - different things 1 2 0 3  Trouble relaxing 1 2 1 3   Restless 0 2 0  3  Easily annoyed or irritable 1 2 1 3   Afraid - awful might happen 0 0 0 0  Total GAD 7 Score 4 11 2 18   Anxiety Difficulty Not difficult at all Somewhat difficult Not difficult at all Somewhat difficult    BP Readings from Last 3 Encounters:   04/08/20 130/80  01/07/20 130/80  05/05/19 120/70    Physical Exam Vitals and nursing note reviewed.  Constitutional:      General: He is irritable.  HENT:     Head: Normocephalic.     Right Ear: Tympanic membrane, ear canal and external ear normal. There is impacted cerumen.     Left Ear: Tympanic membrane, ear canal and external ear normal. There is impacted cerumen.     Nose: Nose normal. No congestion or rhinorrhea.     Mouth/Throat:     Mouth: Oropharynx is clear and moist. Mucous membranes are moist.  Eyes:     General: No scleral icterus.       Right eye: No discharge.        Left eye: No discharge.     Extraocular Movements: Extraocular movements intact and EOM normal.     Conjunctiva/sclera: Conjunctivae normal.     Pupils: Pupils are equal, round, and reactive to light.  Neck:     Thyroid: No thyromegaly.     Vascular: No JVD.     Trachea: No tracheal deviation.  Cardiovascular:     Rate and Rhythm: Normal rate and regular rhythm.     Pulses: Intact distal pulses.     Heart sounds: Normal heart sounds. No murmur heard. No friction rub. No gallop.   Pulmonary:     Effort: No respiratory distress.     Breath sounds: Normal breath sounds. No wheezing or rales.  Abdominal:     General: Bowel sounds are normal.     Palpations: Abdomen is soft. There is no hepatosplenomegaly or mass.     Tenderness: There is no abdominal tenderness. There is no CVA tenderness, guarding or rebound.  Musculoskeletal:        General: No tenderness or edema. Normal range of motion.     Cervical back: Normal range of motion and neck supple.  Lymphadenopathy:     Cervical: No cervical adenopathy.  Skin:    General: Skin is warm.     Findings: No rash.  Neurological:     Mental Status: He is alert and oriented to person, place, and time.     Cranial Nerves: No cranial nerve deficit.     Deep Tendon Reflexes: Strength normal and reflexes are normal and symmetric.     Wt Readings  from Last 3 Encounters:  04/08/20 176 lb (79.8 kg)  01/07/20 177 lb (80.3 kg)  05/05/19 180 lb (81.6 kg)    BP 130/80   Pulse 64   Ht 6\' 3"  (1.905 m)   Wt 176 lb (79.8 kg)   BMI 22.00 kg/m   Assessment and Plan: 1. Current moderate episode of major depressive disorder, unspecified whether recurrent (HCC) Chronic.  Partially controlled.  Stable.  PHQ is 2 we will increase sertraline to 1-1/2 tablets daily and recheck in 6 months. - sertraline (ZOLOFT) 50 MG tablet;  Take 1 and a half tablets daily  Dispense: 135 tablet; Refill: 1  2. Anxiety Chronic.  Partially controlled.  Stable.  Gad score is 4.  As noted we will increase sertraline to 75 mg once a day. - sertraline (ZOLOFT) 50 MG  tablet;   Take 1 and a half tablets daily  Dispense: 135 tablet; Refill: 1  3. Bilateral hearing loss due to cerumen impaction Procedure wise that both ears were irrigated and remaining cerumen was removed with curette.  Both eardrums are intact and patient is able to hear better after removal.

## 2020-10-08 ENCOUNTER — Encounter: Payer: Self-pay | Admitting: Family Medicine

## 2020-10-08 ENCOUNTER — Other Ambulatory Visit: Payer: Self-pay

## 2020-10-08 ENCOUNTER — Ambulatory Visit: Payer: 59 | Admitting: Family Medicine

## 2020-10-08 VITALS — BP 120/80 | HR 72 | Ht 75.0 in | Wt 185.0 lb

## 2020-10-08 DIAGNOSIS — F419 Anxiety disorder, unspecified: Secondary | ICD-10-CM

## 2020-10-08 NOTE — Progress Notes (Signed)
Date:  10/08/2020   Name:  Richard Oneill   DOB:  [birth year]   MRN:  [id number]   Chief Complaint: Follow-up (Been off sertraline x 3 weeks)  Depression        This is a chronic problem.  The current episode started more than 1 year ago.   The onset quality is gradual.   The problem occurs intermittently.  Associated symptoms include irritable and restlessness.  Associated symptoms include no decreased concentration, no fatigue, no helplessness, no hopelessness, does not have insomnia, no decreased interest, no appetite change, no body aches, no myalgias, no headaches, no indigestion, not sad and no suicidal ideas.  Past treatments include SSRIs - Selective serotonin reuptake inhibitors (presently off).  Compliance with treatment is good.  Previous treatment provided mild relief.  No results found for: CREATININE, BUN, NA, K, CL, CO2 No results found for: CHOL, HDL, LDLCALC, LDLDIRECT, TRIG, CHOLHDL No results found for: TSH No results found for: HGBA1C No results found for: WBC, HGB, HCT, MCV, PLT No results found for: ALT, AST, GGT, ALKPHOS, BILITOT   Review of Systems  Constitutional:  Negative for appetite change, chills, fatigue and fever.  HENT:  Negative for drooling, ear discharge, ear pain and sore throat.   Respiratory:  Negative for cough, shortness of breath and wheezing.   Cardiovascular:  Negative for chest pain, palpitations and leg swelling.  Gastrointestinal:  Negative for abdominal pain, blood in stool, constipation, diarrhea and nausea.  Endocrine: Negative for polydipsia.  Genitourinary:  Negative for dysuria, frequency, hematuria and urgency.  Musculoskeletal:  Negative for back pain, myalgias and neck pain.  Skin:  Negative for rash.  Allergic/Immunologic: Negative for environmental allergies.  Neurological:  Negative for dizziness and headaches.  Hematological:  Does not bruise/bleed easily.  Psychiatric/Behavioral:  Positive for depression. Negative for  decreased concentration and suicidal ideas. The patient is not nervous/anxious and does not have insomnia.    Patient Active Problem List   Diagnosis Date Noted   Current moderate episode of major depressive disorder ([place]) 09/13/2018   Anxiety 09/13/2018   Pituitary adenoma ([place]) 09/13/2018   Screening for sickle-cell disease or trait 10/05/2017    No Known Allergies  No past surgical history on file.  Social History   Tobacco Use   Smoking status: Every Day    Pack years: 0.00    Types: E-cigarettes   Smokeless tobacco: Never  Substance Use Topics   Alcohol use: No   Drug use: Never     Medication list has been reviewed and updated.  No outpatient medications have been marked as taking for the 10/08/20 encounter (Office Visit) with Juline Patch, MD.    [facility] 2/9 Scores 10/08/2020 04/08/2020 01/07/2020 05/05/2019  PHQ - 2 Score 1 1 4  0  PHQ- 9 Score 4 2 8 9     GAD 7 : Generalized Anxiety Score 10/08/2020 04/08/2020 01/07/2020 05/05/2019  Nervous, Anxious, on Edge 1 1 1  0  Control/stop worrying 0 0 2 0  Worry too much - different things 0 1 2 0  Trouble relaxing 0 1 2 1   Restless 0 0 2 0  Easily annoyed or irritable 1 1 2 1   Afraid - awful might happen 0 0 0 0  Total GAD 7 Score 2 4 11 2   Anxiety Difficulty Not difficult at all Not difficult at all Somewhat difficult Not difficult at all    BP Readings from Last 3 Encounters:  10/08/20 120/80  04/08/20  130/80  01/07/20 130/80    Physical Exam Vitals and nursing note reviewed.  Constitutional:      General: He is irritable.  HENT:     Head: Normocephalic.     Right Ear: Tympanic membrane, ear canal and external ear normal.     Left Ear: Tympanic membrane, ear canal and external ear normal.     Nose: Nose normal. No congestion or rhinorrhea.  Eyes:     General: No scleral icterus.       Right eye: No discharge.        Left eye: No discharge.     Conjunctiva/sclera: Conjunctivae normal.     Pupils: Pupils are  equal, round, and reactive to light.  Neck:     Thyroid: No thyromegaly.     Vascular: No JVD.     Trachea: No tracheal deviation.  Cardiovascular:     Rate and Rhythm: Normal rate and regular rhythm.     Heart sounds: Normal heart sounds. No murmur heard.   No friction rub. No gallop.  Pulmonary:     Effort: No respiratory distress.     Breath sounds: Normal breath sounds. No wheezing, rhonchi or rales.  Abdominal:     General: Bowel sounds are normal.     Palpations: Abdomen is soft. There is no mass.     Tenderness: There is no abdominal tenderness. There is no guarding or rebound.  Musculoskeletal:        General: No tenderness. Normal range of motion.     Cervical back: Normal range of motion and neck supple.  Lymphadenopathy:     Cervical: No cervical adenopathy.  Skin:    General: Skin is warm.     Findings: No rash.  Neurological:     Mental Status: He is alert and oriented to person, place, and time.     Cranial Nerves: No cranial nerve deficit.     Deep Tendon Reflexes: Reflexes are normal and symmetric.    Wt Readings from Last 3 Encounters:  10/08/20 185 lb (83.9 kg)  04/08/20 176 lb (79.8 kg)  01/07/20 177 lb (80.3 kg)    BP 120/80   Pulse 72   Ht 6\' 3"  (1.905 m)   Wt 185 lb (83.9 kg)   BMI 23.12 kg/m   Assessment and Plan: 1. Anxiety Chronic.  Controlled.  Stable.  Patient is currently off sertraline and is doing well with PHQ of 4 and gad score of 2.  We will proceed with techniques for anxiety control given with relaxation and other therapeutic concerns from a relaxation standpoint.  We will recheck on an as-needed basis.

## 2020-10-08 NOTE — Patient Instructions (Signed)

## 2021-04-12 ENCOUNTER — Ambulatory Visit: Payer: Self-pay | Admitting: Family Medicine

## 2021-12-05 ENCOUNTER — Ambulatory Visit: Payer: Self-pay

## 2021-12-05 ENCOUNTER — Encounter: Payer: Self-pay | Admitting: Family Medicine

## 2021-12-05 ENCOUNTER — Ambulatory Visit: Payer: 59 | Admitting: Family Medicine

## 2021-12-05 ENCOUNTER — Telehealth: Payer: Self-pay

## 2021-12-05 VITALS — BP 120/80 | HR 64 | Ht 75.0 in | Wt 185.0 lb

## 2021-12-05 DIAGNOSIS — R1030 Lower abdominal pain, unspecified: Secondary | ICD-10-CM | POA: Diagnosis not present

## 2021-12-05 DIAGNOSIS — R197 Diarrhea, unspecified: Secondary | ICD-10-CM

## 2021-12-05 NOTE — Telephone Encounter (Signed)
Pt returning call  Pt states he is having lower abdominal cramping and diarrhea since 8-20

## 2021-12-05 NOTE — Telephone Encounter (Signed)
     Chief Complaint: Lower abdominal pain, come and goes Symptoms: Pain 6/10, diarrhea Frequency: 1 week ago Pertinent Negatives: Patient denies  Disposition: '[]'$ ED /'[]'$ Urgent Care (no appt availability in office) / '[x]'$ Appointment(In office/virtual)/ '[]'$  [facility]/ '[]'$ Home Care/ '[]'$ Refused Recommended Disposition /'[]'$ [facility]/ '[]'$  Follow-up with PCP Additional Notes:   Reason for Disposition  [1] MILD-MODERATE pain AND [2] constant AND [3] present > 2 hours  Answer Assessment - Initial Assessment Questions 1. LOCATION: "Where does it hurt?"      Lower abdomen 2. RADIATION: "Does the pain shoot anywhere else?" (e.g., chest, back)     No 3. ONSET: "When did the pain begin?" (Minutes, hours or days ago)      Last week 4. SUDDEN: "Gradual or sudden onset?"     Gradual 5. PATTERN "Does the pain come and go, or is it constant?"    - If it comes and goes: "How long does it last?" "Do you have pain now?"     (Note: Comes and goes means the pain is intermittent. It goes away completely between bouts.)    - If constant: "Is it getting better, staying the same, or getting worse?"      (Note: Constant means the pain never goes away completely; most serious pain is constant and gets worse.)      Comes and goes 6. SEVERITY: "How bad is the pain?"  (e.g., Scale 1-10; mild, moderate, or severe)    - MILD (1-3): Doesn't interfere with normal activities, abdomen soft and not tender to touch.     - MODERATE (4-7): Interferes with normal activities or awakens from sleep, abdomen tender to touch.     - SEVERE (8-10): Excruciating pain, doubled over, unable to do any normal activities.       Now - 6 7. RECURRENT SYMPTOM: "Have you ever had this type of stomach pain before?" If Yes, ask: "When was the last time?" and "What happened that time?"      Yes 8. CAUSE: "What do you think is causing the stomach pain?"     IBS 9. RELIEVING/AGGRAVATING FACTORS: "What makes it better or  worse?" (e.g., antacids, bending or twisting motion, bowel movement)     No 10. OTHER SYMPTOMS: "Do you have any other symptoms?" (e.g., back pain, diarrhea, fever, urination pain, vomiting)       Diarrhea  Protocols used: Abdominal Pain - Male-A-AH

## 2021-12-05 NOTE — Telephone Encounter (Signed)
Called and left message asking what was going on with abdominal pain

## 2021-12-05 NOTE — Progress Notes (Signed)
Date:  12/05/2021   Name:  Richard Oneill   DOB:  05-Dec-1995   MRN:  [id number]   Chief Complaint: Irritable Bowel Syndrome ("Cramping" off and on- started about a week ago- was getting better on Saturday and then Sunday started with diarrhea again. Hx of IBS- use to take Bentyl)  Diarrhea  Associated symptoms include abdominal pain and increased flatus. Pertinent negatives include no chills, fever or vomiting.  Abdominal Pain This is a new problem. The current episode started 1 to 4 weeks ago. The onset quality is gradual. The problem occurs 2 to 4 times per day. The problem has been waxing and waning. The pain is located in the LLQ, RLQ and suprapubic region. The pain is at a severity of 7/10 (last night). The pain is moderate. The quality of the pain is colicky. Associated symptoms include diarrhea and flatus. Pertinent negatives include no constipation, dysuria, fever, frequency, hematochezia, hematuria, melena, nausea or vomiting. The pain is aggravated by eating. The pain is relieved by Bowel movements. He has tried nothing for the symptoms. The treatment provided moderate relief.    No results found for: "NA", "K", "CO2", "GLUCOSE", "BUN", "CREATININE", "CALCIUM", "EGFR", "GFRNONAA" No results found for: "CHOL", "HDL", "LDLCALC", "LDLDIRECT", "TRIG", "CHOLHDL" No results found for: "TSH" No results found for: "HGBA1C" No results found for: "WBC", "HGB", "HCT", "MCV", "PLT" No results found for: "ALT", "AST", "GGT", "ALKPHOS", "BILITOT" No results found for: "25OHVITD2", "25OHVITD3", "VD25OH"   Review of Systems  Constitutional:  Negative for chills and fever.  Respiratory:  Negative for chest tightness and shortness of breath.   Cardiovascular:  Negative for chest pain.  Gastrointestinal:  Positive for abdominal pain, diarrhea and flatus. Negative for anal bleeding, blood in stool, constipation, hematochezia, melena, nausea and vomiting.  Genitourinary:  Negative for difficulty  urinating, dysuria, frequency and hematuria.    Patient Active Problem List   Diagnosis Date Noted   Current moderate episode of major depressive disorder ([place]) 09/13/2018   Anxiety 09/13/2018   Pituitary adenoma () 09/13/2018   Screening for sickle-cell disease or trait 10/05/2017    No Known Allergies  No past surgical history on file.  Social History   Tobacco Use   Smoking status: Every Day    Types: E-cigarettes   Smokeless tobacco: Never  Substance Use Topics   Alcohol use: No   Drug use: Never     Medication list has been reviewed and updated.  No outpatient medications have been marked as taking for the 12/05/21 encounter (Office Visit) with Juline Patch, MD.       12/05/2021   11:24 AM 10/08/2020    8:17 AM 04/08/2020    8:30 AM 01/07/2020   11:19 AM  GAD 7 : Generalized Anxiety Score  Nervous, Anxious, on Edge '1 1 1 1  ' Control/stop worrying 1 0 0 2  Worry too much - different things 0 0 1 2  Trouble relaxing 1 0 1 2  Restless 1 0 0 2  Easily annoyed or irritable '1 1 1 2  ' Afraid - awful might happen 0 0 0 0  Total GAD 7 Score '5 2 4 11  ' Anxiety Difficulty Not difficult at all Not difficult at all Not difficult at all Somewhat difficult       12/05/2021   11:23 AM 10/08/2020    8:16 AM 04/08/2020    8:29 AM  Depression screen PHQ 2/9  Decreased Interest 1 0 0  Down, Depressed, Hopeless  '1 1 1  ' PHQ - 2 Score '2 1 1  ' Altered sleeping 1 0 0  Tired, decreased energy 1 1 0  Change in appetite 0 0 0  Feeling bad or failure about yourself  0 0 0  Trouble concentrating 1 1 0  Moving slowly or fidgety/restless 0 1 1  Suicidal thoughts 0 0 0  PHQ-9 Score '5 4 2  ' Difficult doing work/chores Not difficult at all Not difficult at all Not difficult at all    BP Readings from Last 3 Encounters:  12/05/21 120/80  10/08/20 120/80  04/08/20 130/80    Physical Exam Vitals and nursing note reviewed.  HENT:     Head: Normocephalic.     Right Ear: External  ear normal.     Left Ear: External ear normal.     Nose: Nose normal.  Eyes:     General: No scleral icterus.       Right eye: No discharge.        Left eye: No discharge.     Conjunctiva/sclera: Conjunctivae normal.     Pupils: Pupils are equal, round, and reactive to light.  Neck:     Thyroid: No thyromegaly.     Vascular: No JVD.     Trachea: No tracheal deviation.  Cardiovascular:     Rate and Rhythm: Normal rate and regular rhythm.     Heart sounds: Normal heart sounds. No murmur heard.    No friction rub. No gallop.  Pulmonary:     Effort: No respiratory distress.     Breath sounds: Normal breath sounds. No wheezing or rales.  Abdominal:     General: Bowel sounds are normal.     Palpations: Abdomen is soft. There is no hepatomegaly, splenomegaly or mass.     Tenderness: There is abdominal tenderness in the right lower quadrant, suprapubic area and left lower quadrant. There is no right CVA tenderness, left CVA tenderness, guarding or rebound.  Musculoskeletal:        General: No tenderness. Normal range of motion.     Cervical back: Normal range of motion and neck supple.  Lymphadenopathy:     Cervical: No cervical adenopathy.  Skin:    General: Skin is warm.     Findings: No rash.  Neurological:     Mental Status: He is alert and oriented to person, place, and time.     Cranial Nerves: No cranial nerve deficit.     Deep Tendon Reflexes: Reflexes are normal and symmetric.     Wt Readings from Last 3 Encounters:  12/05/21 185 lb (83.9 kg)  10/08/20 185 lb (83.9 kg)  04/08/20 176 lb (79.8 kg)    BP 120/80   Pulse 64   Ht '6\' 3"'  (1.905 m)   Wt 185 lb (83.9 kg)   BMI 23.12 kg/m   Assessment and Plan:  1. Diarrhea, unspecified type New onset.  Onset 10 days ago.  Episodic several times a day.  Without fever or chills lower abdominal cramping.  We will check a stool culture and C. difficile along with renal function CBC.  We will also check UA. - Renal Function  Panel - POCT Urinalysis Dipstick - CBC with Differential/Platelet - Cdiff NAA+O+P+Stool Culture  2. Lower abdominal pain Lower abdominal pain as needed.  Colicky in nature.  Intermittent and currently 0 to 1 but last night was at a 6.  Relieved with BM.  We will proceed with stool culture and C. difficile along with  renal function panel and CBC as well as iron urinalysis. - Renal Function Panel - POCT Urinalysis Dipstick - CBC with Differential/Platelet    Otilio Miu, MD

## 2021-12-06 ENCOUNTER — Other Ambulatory Visit: Payer: Self-pay

## 2021-12-06 DIAGNOSIS — R197 Diarrhea, unspecified: Secondary | ICD-10-CM

## 2021-12-06 DIAGNOSIS — R1030 Lower abdominal pain, unspecified: Secondary | ICD-10-CM

## 2021-12-06 LAB — RENAL FUNCTION PANEL
Albumin: 4.6 g/dL (ref 4.3–5.2)
BUN/Creatinine Ratio: 9 (ref 9–20)
BUN: 11 mg/dL (ref 6–20)
CO2: 24 mmol/L (ref 20–29)
Calcium: 9.6 mg/dL (ref 8.7–10.2)
Chloride: 104 mmol/L (ref 96–106)
Creatinine, Ser: 1.2 mg/dL (ref 0.76–1.27)
Glucose: 81 mg/dL (ref 70–99)
Phosphorus: 4.1 mg/dL (ref 2.8–4.1)
Potassium: 4.3 mmol/L (ref 3.5–5.2)
Sodium: 141 mmol/L (ref 134–144)
eGFR: 87 mL/min/{1.73_m2} (ref >59)

## 2021-12-06 LAB — CBC WITH DIFFERENTIAL/PLATELET
Basophils Absolute: 0.1 10*3/uL (ref 0.0–0.2)
Basos: 1 %
EOS (ABSOLUTE): 0.1 10*3/uL (ref 0.0–0.4)
Eos: 2 %
Hematocrit: 44.6 % (ref 37.5–51.0)
Hemoglobin: 15 g/dL (ref 13.0–17.7)
Immature Grans (Abs): 0 10*3/uL (ref 0.0–0.1)
Immature Granulocytes: 0 %
Lymphocytes Absolute: 2.1 10*3/uL (ref 0.7–3.1)
Lymphs: 37 %
MCH: 30 pg (ref 26.6–33.0)
MCHC: 33.6 g/dL (ref 31.5–35.7)
MCV: 89 fL (ref 79–97)
Monocytes Absolute: 0.5 10*3/uL (ref 0.1–0.9)
Monocytes: 9 %
Neutrophils Absolute: 2.9 10*3/uL (ref 1.4–7.0)
Neutrophils: 51 %
Platelets: 265 10*3/uL (ref 150–450)
RBC: 5 x10E6/uL (ref 4.14–5.80)
RDW: 12.6 % (ref 11.6–15.4)
WBC: 5.7 10*3/uL (ref 3.4–10.8)

## 2021-12-20 ENCOUNTER — Telehealth: Payer: Self-pay | Admitting: Family Medicine

## 2021-12-20 NOTE — Telephone Encounter (Signed)
Copied from [place] [id number]. Topic: General - Other >> Dec 20, 2021 11:03 AM Leitha Schuller wrote: Caller informing Richard Oneill, pt will do the stool sample tonight (9-12) and drop it off at the clinic tomorrow morning  (9-13)

## 2021-12-25 LAB — CDIFF NAA+O+P+STOOL CULTURE
E coli, Shiga toxin Assay: NEGATIVE
Toxigenic C. Difficile by PCR: NEGATIVE

## 2022-07-03 ENCOUNTER — Ambulatory Visit: Payer: 59 | Admitting: Family Medicine

## 2022-07-03 ENCOUNTER — Encounter: Payer: Self-pay | Admitting: Family Medicine

## 2022-07-03 VITALS — BP 124/78 | HR 94 | Ht 75.0 in | Wt 186.0 lb

## 2022-07-03 DIAGNOSIS — F419 Anxiety disorder, unspecified: Secondary | ICD-10-CM | POA: Diagnosis not present

## 2022-07-03 DIAGNOSIS — H6123 Impacted cerumen, bilateral: Secondary | ICD-10-CM | POA: Diagnosis not present

## 2022-07-03 MED ORDER — BUSPIRONE HCL 7.5 MG PO TABS: 7.5000 mg | ORAL_TABLET | Freq: Two times a day (BID) | ORAL | 3 refills | Status: DC

## 2022-07-03 NOTE — Progress Notes (Signed)
Date:  07/03/2022   Name:  Richard Oneill   DOB:  04-20-1995   MRN:  [id number]   Chief Complaint: Ear Fullness and Anxiety  Ear Fullness  There is pain in both ears. This is a chronic problem. There has been no fever. He has tried nothing for the symptoms.  Anxiety Presents for follow-up visit. Symptoms include excessive worry, nervous/anxious behavior and restlessness. Patient reports no chest pain, compulsions, confusion, decreased concentration, depressed mood, dizziness, dry mouth, feeling of choking, hyperventilation, impotence, insomnia, irritability, malaise, muscle tension, nausea, obsessions, palpitations, panic, shortness of breath or suicidal ideas.      Lab Results  Component Value Date   NA 141 12/05/2021   K 4.3 12/05/2021   CO2 24 12/05/2021   GLUCOSE 81 12/05/2021   BUN 11 12/05/2021   CREATININE 1.20 12/05/2021   CALCIUM 9.6 12/05/2021   EGFR 87 12/05/2021   No results found for: "CHOL", "HDL", "LDLCALC", "LDLDIRECT", "TRIG", "CHOLHDL" No results found for: "TSH" No results found for: "HGBA1C" Lab Results  Component Value Date   WBC 5.7 12/05/2021   HGB 15.0 12/05/2021   HCT 44.6 12/05/2021   MCV 89 12/05/2021   PLT 265 12/05/2021   No results found for: "ALT", "AST", "GGT", "ALKPHOS", "BILITOT" No results found for: "25OHVITD2", "25OHVITD3", "VD25OH"   Review of Systems  Constitutional:  Negative for irritability.  Respiratory:  Negative for choking, shortness of breath and wheezing.   Cardiovascular:  Negative for chest pain and palpitations.  Gastrointestinal:  Negative for nausea.  Genitourinary:  Negative for impotence.  Neurological:  Negative for dizziness and light-headedness.  Psychiatric/Behavioral:  Negative for confusion, decreased concentration and suicidal ideas. The patient is nervous/anxious. The patient does not have insomnia.     Patient Active Problem List   Diagnosis Date Noted   Current moderate episode of major depressive  disorder ([facility]) 09/13/2018   Anxiety 09/13/2018   Pituitary adenoma ([place]) 09/13/2018   Screening for sickle-cell disease or trait 10/05/2017    No Known Allergies  History reviewed. No pertinent surgical history.  Social History   Tobacco Use   Smoking status: Every Day    Types: E-cigarettes   Smokeless tobacco: Never  Substance Use Topics   Alcohol use: No   Drug use: Never     Medication list has been reviewed and updated.  No outpatient medications have been marked as taking for the 07/03/22 encounter (Office Visit) with Juline Patch, MD.       07/03/2022    1:56 PM 12/05/2021   11:24 AM 10/08/2020    8:17 AM 04/08/2020    8:30 AM  GAD 7 : Generalized Anxiety Score  Nervous, Anxious, on Edge 1 1 1 1   Control/stop worrying 1 1 0 0  Worry too much - different things 1 0 0 1  Trouble relaxing 1 1 0 1  Restless 1 1 0 0  Easily annoyed or irritable 0 1 1 1   Afraid - awful might happen 0 0 0 0  Total GAD 7 Score 5 5 2 4   Anxiety Difficulty Not difficult at all Not difficult at all Not difficult at all Not difficult at all       12/05/2021   11:23 AM 10/08/2020    8:16 AM 04/08/2020    8:29 AM  Depression screen PHQ 2/9  Decreased Interest 1 0 0  Down, Depressed, Hopeless 1 1 1   PHQ - 2 Score 2 1 1   Altered sleeping  1 0 0  Tired, decreased energy 1 1 0  Change in appetite 0 0 0  Feeling bad or failure about yourself  0 0 0  Trouble concentrating 1 1 0  Moving slowly or fidgety/restless 0 1 1  Suicidal thoughts 0 0 0  PHQ-9 Score 5 4 2   Difficult doing work/chores Not difficult at all Not difficult at all Not difficult at all    BP Readings from Last 3 Encounters:  07/03/22 124/78  12/05/21 120/80  10/08/20 120/80    Physical Exam HENT:     Right Ear: Tympanic membrane and ear canal normal.     Left Ear: Tympanic membrane and ear canal normal.     Ears:     Comments: Cerumen impaction removed Cardiovascular:     Heart sounds: No murmur heard.    No  friction rub. No gallop.  Pulmonary:     Breath sounds: No stridor. No wheezing, rhonchi or rales.  Abdominal:     General: There is no distension.     Palpations: There is no hepatomegaly, splenomegaly or mass.     Tenderness: There is no abdominal tenderness.     Wt Readings from Last 3 Encounters:  07/03/22 186 lb (84.4 kg)  12/05/21 185 lb (83.9 kg)  10/08/20 185 lb (83.9 kg)    BP 124/78   Pulse 94   Ht 6\' 3"  (1.905 m)   Wt 186 lb (84.4 kg)   SpO2 98%   BMI 23.25 kg/m   Assessment and Plan: 1. Anxiety Chronic.  Episodic and exacerbation.  Stable.  PHQ is 5 GAD score is 5 patient did not like the way he felt on sertraline.  Will begin with buspirone 7.5 mg twice a day.  Will recheck in 4 to 6 months.  2. Bilateral impacted cerumen New onset.  Bilateral cerumen impaction.  Areas were gently irrigated and cerumen removed.  Tympanic membranes are observed to be normal.    Otilio Miu, MD

## 2022-07-25 ENCOUNTER — Other Ambulatory Visit: Payer: Self-pay | Admitting: Family Medicine

## 2022-07-26 NOTE — Telephone Encounter (Signed)
Unable to refill per protocol, Rx request is too soon. Last refill 07/03/22 for 30 and 3 refills.  Requested Prescriptions  Pending Prescriptions Disp Refills   busPIRone (BUSPAR) 7.5 MG tablet [Pharmacy Med Name: BUSPIRONE HCL 7.5 MG TABLET] 180 tablet 2    Sig: TAKE 1 TABLET BY MOUTH 2 TIMES DAILY.     Psychiatry: Anxiolytics/Hypnotics - Non-controlled Passed - 07/25/2022  1:32 PM      Passed - Valid encounter within last 12 months    Recent Outpatient Visits           3 weeks ago Anxiety   [place] [facility]   7 months ago Lower abdominal pain   [place] [facility]   1 year ago Anxiety   [place] [facility]   2 years ago Current moderate episode of major depressive disorder, unspecified whether recurrent [facility])   [place] [facility]   2 years ago Current moderate episode of major depressive disorder, unspecified whether recurrent [facility])   [place] [facility]

## 2022-09-04 ENCOUNTER — Other Ambulatory Visit: Payer: Self-pay | Admitting: Family Medicine

## 2022-12-14 ENCOUNTER — Other Ambulatory Visit: Payer: Self-pay | Admitting: Family Medicine

## 2022-12-27 ENCOUNTER — Other Ambulatory Visit: Payer: Self-pay | Admitting: Family Medicine

## 2022-12-28 NOTE — Telephone Encounter (Signed)
Courtesy refill given, appointment needed.  Requested Prescriptions  Pending Prescriptions Disp Refills   busPIRone (BUSPAR) 7.5 MG tablet [Pharmacy Med Name: BUSPIRONE HCL 7.5 MG TABLET] 180 tablet 1    Sig: TAKE 1 TABLET BY MOUTH TWICE A DAY     Psychiatry: Anxiolytics/Hypnotics - Non-controlled Passed - 12/27/2022  1:20 PM      Passed - Valid encounter within last 12 months    Recent Outpatient Visits           5 months ago Anxiety   [place] [facility]   1 year ago Lower abdominal pain   [place] [facility]   2 years ago Anxiety   [place] [facility]   2 years ago Current moderate episode of major depressive disorder, unspecified whether recurrent [facility])   [place] [facility]   2 years ago Current moderate episode of major depressive disorder, unspecified whether recurrent [facility])    [facility]

## 2023-09-18 ENCOUNTER — Ambulatory Visit: Admitting: Student

## 2023-09-18 ENCOUNTER — Encounter: Payer: Self-pay | Admitting: Student

## 2023-09-18 VITALS — BP 124/86 | HR 67 | Ht 75.0 in | Wt 204.0 lb

## 2023-09-18 DIAGNOSIS — N529 Male erectile dysfunction, unspecified: Secondary | ICD-10-CM

## 2023-09-18 DIAGNOSIS — D352 Benign neoplasm of pituitary gland: Secondary | ICD-10-CM

## 2023-09-18 DIAGNOSIS — H6123 Impacted cerumen, bilateral: Secondary | ICD-10-CM

## 2023-09-18 DIAGNOSIS — F321 Major depressive disorder, single episode, moderate: Secondary | ICD-10-CM | POA: Diagnosis not present

## 2023-09-18 NOTE — Assessment & Plan Note (Addendum)
 Incidental finding after concussion. Saw endocrinology in 2019 but never followed up after. Testosterone  and prolactin elevated. Repeat on 09/13/2018 with elevated prolactin but normal testosterone . Repeat prolactin today. If still elevated may be contributing to his ED.  Referral made to endocrinology.

## 2023-09-18 NOTE — Assessment & Plan Note (Signed)
 Previously on zoloft  PHQ9 is 0 today. Not currently on medication. Continue to monitor.

## 2023-09-18 NOTE — Progress Notes (Signed)
 Established Patient Office Visit  Subjective   Patient ID: [name], male    DOB: 06/23/1995  Age: 28 y.o. MRN: [id number]  Chief Complaint  Patient presents with   Hearing Problem    Pt thinks it might be allergies or a buildup of earwax. Wants his ears looked at.    Erectile Dysfunction    X2 years ago having trouble getting an erection. Was taking medication for it but developed headaches. Wants to take medication again     Hearing problem Tends to happen around spring and summer. Went to the pool last weekend and has been feeling fullness in the right ear with decreased hearing. Thinks he may have got water in it. No fever chill, tinnitus, bleeding drainage, sinus pain.   Pituitary adenoma Noted after concussion in 2019. Saw endocrinology but has not followed up since. Reports rare headaches and decreased . Elevated prolactin on prior labs. Denies fevers, gynecomastia, galactorrhea, blurred vision, or diplopia.   Erectile dysfunction Started a month ago. Sexually active with girlfriend of 5 years ago. Had an difficulty achieving erection recently and has has difficulty about this since then. Also reports issues with premature ejaculation. Has used sildenafil before with improvement but did get headaches. Feels morning/nocturnal erections. Denies depression or anxiety, drug  or alcohol use.    Patient Active Problem List   Diagnosis Date Noted   Impacted cerumen of both ears 09/18/2023   ED (erectile dysfunction) 09/18/2023   Current moderate episode of major depressive disorder (HCC) 09/13/2018   Pituitary adenoma ([facility]) 09/13/2018      ROS Refer to HPI    Objective:     BP 124/86   Pulse 67   Ht 6\' 3"  (1.905 m)   Wt 204 lb (92.5 kg)   SpO2 98%   BMI 25.50 kg/m  BP Readings from Last 3 Encounters:  09/18/23 124/86  07/03/22 124/78  12/05/21 120/80    Physical Exam Constitutional:      Appearance: Normal appearance.  HENT:     Right Ear: Tympanic  membrane normal. There is impacted cerumen.     Left Ear: Tympanic membrane normal. There is impacted cerumen.     Mouth/Throat:     Mouth: Mucous membranes are moist.     Pharynx: Oropharynx is clear.  Cardiovascular:     Rate and Rhythm: Normal rate and regular rhythm.  Pulmonary:     Effort: Pulmonary effort is normal.     Breath sounds: No rhonchi or rales.  Chest:     Chest wall: No mass, swelling or tenderness.  Abdominal:     General: Abdomen is flat. Bowel sounds are normal. There is no distension.     Palpations: Abdomen is soft.     Tenderness: There is no abdominal tenderness.  Musculoskeletal:        General: Normal range of motion.     Right lower leg: No edema.     Left lower leg: No edema.  Skin:    General: Skin is warm and dry.     Capillary Refill: Capillary refill takes less than 2 seconds.  Neurological:     General: No focal deficit present.     Mental Status: He is alert and oriented to person, place, and time.     Comments: Normal visual fields  Psychiatric:        Mood and Affect: Mood normal.        Behavior: Behavior normal.  09/18/2023    2:23 PM 12/05/2021   11:23 AM 10/08/2020    8:16 AM  Depression screen PHQ 2/9  Decreased Interest 0 1 0  Down, Depressed, Hopeless 0 1 1  PHQ - 2 Score 0 2 1  Altered sleeping 1 1 0  Tired, decreased energy 1 1 1   Change in appetite 0 0 0  Feeling bad or failure about yourself  0 0 0  Trouble concentrating 0 1 1  Moving slowly or fidgety/restless 0 0 1  Suicidal thoughts 0 0 0  PHQ-9 Score 2 5 4   Difficult doing work/chores Not difficult at all Not difficult at all Not difficult at all       09/18/2023    2:24 PM 07/03/2022    1:56 PM 12/05/2021   11:24 AM 10/08/2020    8:17 AM  GAD 7 : Generalized Anxiety Score  Nervous, Anxious, on Edge 0 1 1 1   Control/stop worrying 0 1 1 0  Worry too much - different things 0 1 0 0  Trouble relaxing 0 1 1 0  Restless 0 1 1 0  Easily annoyed or irritable 0 0  1 1  Afraid - awful might happen 0 0 0 0  Total GAD 7 Score 0 5 5 2   Anxiety Difficulty Not difficult at all Not difficult at all Not difficult at all Not difficult at all    No results found for any visits on 09/18/23.  Last CBC Lab Results  Component Value Date   WBC 5.7 12/05/2021   HGB 15.0 12/05/2021   HCT 44.6 12/05/2021   MCV 89 12/05/2021   MCH 30.0 12/05/2021   RDW 12.6 12/05/2021   PLT 265 12/05/2021   Last metabolic panel Lab Results  Component Value Date   GLUCOSE 81 12/05/2021   NA 141 12/05/2021   K 4.3 12/05/2021   CL 104 12/05/2021   CO2 24 12/05/2021   BUN 11 12/05/2021   CREATININE 1.20 12/05/2021   EGFR 87 12/05/2021   CALCIUM 9.6 12/05/2021   PHOS 4.1 12/05/2021   ALBUMIN 4.6 12/05/2021      The ASCVD Risk score (Arnett DK, et al., 2019) failed to calculate for the following reasons:   The 2019 ASCVD risk score is only valid for ages 51 to 70    Assessment & Plan:  Pituitary adenoma [facility]) Assessment & Plan: Incidental finding after concussion. Saw endocrinology in 2019 but never followed up after. Testosterone  and prolactin elevated. Repeat on 09/13/2018 with elevated prolactin but normal testosterone . Repeat prolactin today. If still elevated may be contributing to his ED.  Referral made to endocrinology.   Orders: -     Prolactin -     Ambulatory referral to Endocrinology  Impacted cerumen of both ears Assessment & Plan: Improved with irrigation. Recommended keeping ears dry after swimming and trying home debrox drops as needed.   Current moderate episode of major depressive disorder, unspecified whether recurrent (HCC) Assessment & Plan: Previously on zoloft  PHQ9 is 0 today. Not currently on medication. Continue to monitor.   Erectile dysfunction, unspecified erectile dysfunction type Assessment & Plan: Decrease nighttime tumescence and premature ejaculation. Denied drug use, no risk factors for vascular cause.  Does have history of  elevated prolactin which may be contributing. Will check prolactin today. There may be a psychogenic component as symptoms worsened after recent issue. 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       Return in about 2 months (around  11/18/2023) for physical.    Barnetta Liberty, MD

## 2023-09-18 NOTE — Assessment & Plan Note (Signed)
 Decrease nighttime tumescence and premature ejaculation. Denied drug use, no risk factors for vascular cause.  Does have history of elevated prolactin which may be contributing. Will check prolactin today. There may be a psychogenic component as symptoms worsened after recent issue. 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

## 2023-09-18 NOTE — Assessment & Plan Note (Signed)
 Improved with irrigation. Recommended keeping ears dry after swimming and trying home debrox drops as needed.

## 2023-09-19 ENCOUNTER — Ambulatory Visit: Payer: Self-pay | Admitting: Student

## 2023-09-19 DIAGNOSIS — D352 Benign neoplasm of pituitary gland: Secondary | ICD-10-CM

## 2023-09-19 LAB — PROLACTIN: Prolactin: 12.6 ng/mL (ref 3.6–31.5)

## 2023-09-27 ENCOUNTER — Telehealth: Payer: Self-pay

## 2023-09-27 NOTE — Telephone Encounter (Signed)
 Could you please look into this.   Thank you,  [name]

## 2023-09-27 NOTE — Telephone Encounter (Signed)
 Copied from CRM [id number]. Topic: Clinical - Lab/Test Results >> Sep 27, 2023  1:07 PM Lysbeth Sauger wrote: Reason for CRM: Tameka from [facility] clinic is calling because she said she needs labs and imaging and the reason for the referral for the pt sent over please.

## 2024-04-14 ENCOUNTER — Encounter: Payer: Self-pay | Admitting: Student

## 2024-04-14 ENCOUNTER — Ambulatory Visit: Admitting: Student

## 2024-04-14 VITALS — BP 122/80 | HR 80 | Ht 75.0 in | Wt 204.0 lb

## 2024-04-14 DIAGNOSIS — H6123 Impacted cerumen, bilateral: Secondary | ICD-10-CM

## 2024-04-14 NOTE — Progress Notes (Signed)
 "  Established Patient Office Visit  Subjective   Patient ID: Richard Oneill, male    DOB: [birth year]  Age: 29 y.o. MRN: [id number]  Chief Complaint  Patient presents with   Ear Fullness    Patient reports his ears filling full and may need an ear irrigation     Richard Oneill is a 29 y.o. person with medical hx listed below who presents today for ears fullness for the past week. Has been using ear drops but has not been helping. Does use q tips or other instruments in the ear. No fevers, hearing change, ear pain, swelling, tinnitus, or ear drainage.   Patient Active Problem List   Diagnosis Date Noted   Impacted cerumen of both ears 09/18/2023   ED (erectile dysfunction) 09/18/2023   Current moderate episode of major depressive disorder (HCC) 09/13/2018   Pituitary adenoma ([facility]CC) 09/13/2018      ROS Refer to HPI    Objective:     Outpatient Encounter Medications as of 04/14/2024  Medication Sig   busPIRone  (BUSPAR ) 7.5 MG tablet TAKE 1 TABLET BY MOUTH TWICE A DAY (Patient not taking: Reported on 04/14/2024)   No facility-administered encounter medications on file as of 04/14/2024.    BP 122/80   Pulse 80   Ht 6' 3 (1.905 m)   Wt 204 lb (92.5 kg)   SpO2 99%   BMI 25.50 kg/m  BP Readings from Last 3 Encounters:  04/14/24 122/80  09/18/23 124/86  07/03/22 124/78    Physical Exam Constitutional:      Appearance: Normal appearance.  HENT:     Right Ear: Tympanic membrane and ear canal normal. There is impacted cerumen.     Left Ear: Tympanic membrane and ear canal normal. There is impacted cerumen.     Mouth/Throat:     Mouth: Mucous membranes are moist.     Pharynx: Oropharynx is clear.  Eyes:     Extraocular Movements: Extraocular movements intact.     Pupils: Pupils are equal, round, and reactive to light.  Cardiovascular:     Rate and Rhythm: Normal rate and regular rhythm.  Pulmonary:     Effort: Pulmonary effort is normal.     Breath sounds: No rales.   Abdominal:     General: Abdomen is flat.     Palpations: Abdomen is soft.  Skin:    General: Skin is warm and dry.     Capillary Refill: Capillary refill takes less than 2 seconds.  Neurological:     General: No focal deficit present.     Mental Status: He is alert and oriented to person, place, and time.  Psychiatric:        Mood and Affect: Mood normal.        Behavior: Behavior normal.        09/18/2023    2:23 PM 12/05/2021   11:23 AM 10/08/2020    8:16 AM  Depression screen PHQ 2/9  Decreased Interest 0 1 0  Down, Depressed, Hopeless 0 1 1  PHQ - 2 Score 0 2 1  Altered sleeping 1 1 0  Tired, decreased energy 1 1 1   Change in appetite 0 0 0  Feeling bad or failure about yourself  0 0 0  Trouble concentrating 0 1 1  Moving slowly or fidgety/restless 0 0 1  Suicidal thoughts 0 0 0  PHQ-9 Score 2  5  4    Difficult doing work/chores Not difficult at all Not difficult  at all Not difficult at all     Data saved with a previous flowsheet row definition       09/18/2023    2:24 PM 07/03/2022    1:56 PM 12/05/2021   11:24 AM 10/08/2020    8:17 AM  GAD 7 : Generalized Anxiety Score  Nervous, Anxious, on Edge 0 1 1 1   Control/stop worrying 0 1 1 0  Worry too much - different things 0 1 0 0  Trouble relaxing 0 1 1 0  Restless 0 1 1 0  Easily annoyed or irritable 0 0 1 1  Afraid - awful might happen 0 0 0 0  Total GAD 7 Score 0 5 5 2   Anxiety Difficulty Not difficult at all Not difficult at all Not difficult at all Not difficult at all    No results found for any visits on 04/14/24.    The ASCVD Risk score (Arnett DK, et al., 2019) failed to calculate for the following reasons:   The 2019 ASCVD risk score is only valid for ages 55 to 85   * - Cholesterol units were assumed    Assessment & Plan:  Impacted cerumen of both ears Assessment & Plan: Impacted cerumen of both ears, resolved with irrigation.Normal canal and TM bilaterally after removal of cerumen.        Harlene Saddler, MD "

## 2024-04-14 NOTE — Assessment & Plan Note (Signed)
 Impacted cerumen of both ears, resolved with irrigation.Normal canal and TM bilaterally after removal of cerumen.
